# Patient Record
Sex: Female | Born: 1970
Health system: Southern US, Community
[De-identification: ages and names within clinical notes are randomized; demographics above are authoritative.]

## PROBLEM LIST (undated history)

## (undated) DIAGNOSIS — E28319 Asymptomatic premature menopause: Secondary | ICD-10-CM

## (undated) DIAGNOSIS — E785 Hyperlipidemia, unspecified: Secondary | ICD-10-CM

## (undated) DIAGNOSIS — K219 Gastro-esophageal reflux disease without esophagitis: Secondary | ICD-10-CM

## (undated) DIAGNOSIS — R519 Headache, unspecified: Secondary | ICD-10-CM

## (undated) DIAGNOSIS — E119 Type 2 diabetes mellitus without complications: Secondary | ICD-10-CM

## (undated) DIAGNOSIS — I10 Essential (primary) hypertension: Secondary | ICD-10-CM

## (undated) DIAGNOSIS — J309 Allergic rhinitis, unspecified: Secondary | ICD-10-CM

## (undated) DIAGNOSIS — R51 Headache: Secondary | ICD-10-CM

## (undated) HISTORY — DX: Headache: R51

## (undated) HISTORY — DX: Type 2 diabetes mellitus without complications: E11.9

## (undated) HISTORY — DX: Hyperlipidemia, unspecified: E78.5

## (undated) HISTORY — PX: LAMINECTOMY: SHX219

## (undated) HISTORY — DX: Asymptomatic premature menopause: E28.319

## (undated) HISTORY — DX: Headache, unspecified: R51.9

## (undated) HISTORY — DX: Allergic rhinitis, unspecified: J30.9

## (undated) HISTORY — DX: Essential (primary) hypertension: I10

## (undated) HISTORY — PX: OPEN NEEDLE BIOPSY SPINE: SUR899

## (undated) HISTORY — PX: EMBOLIZATION: SHX1496

## (undated) HISTORY — PX: VERTEBROPLASTY: SHX113

## (undated) HISTORY — DX: Gastro-esophageal reflux disease without esophagitis: K21.9

---

## 1997-12-20 ENCOUNTER — Other Ambulatory Visit: Admission: RE | Admit: 1997-12-20 | Discharge: 1997-12-20 | Payer: Self-pay | Admitting: Obstetrics & Gynecology

## 1998-11-18 ENCOUNTER — Other Ambulatory Visit: Admission: RE | Admit: 1998-11-18 | Discharge: 1998-11-18 | Payer: Self-pay | Admitting: Obstetrics & Gynecology

## 2000-02-08 ENCOUNTER — Other Ambulatory Visit: Admission: RE | Admit: 2000-02-08 | Discharge: 2000-02-08 | Payer: Self-pay | Admitting: Obstetrics & Gynecology

## 2001-03-19 ENCOUNTER — Encounter: Payer: Self-pay | Admitting: Obstetrics & Gynecology

## 2001-03-19 ENCOUNTER — Encounter: Payer: Self-pay | Admitting: Obstetrics and Gynecology

## 2001-03-19 ENCOUNTER — Ambulatory Visit (HOSPITAL_COMMUNITY): Admission: RE | Admit: 2001-03-19 | Discharge: 2001-03-19 | Payer: Self-pay | Admitting: Obstetrics and Gynecology

## 2001-07-09 ENCOUNTER — Emergency Department (HOSPITAL_COMMUNITY): Admission: EM | Admit: 2001-07-09 | Discharge: 2001-07-09 | Payer: Self-pay | Admitting: Emergency Medicine

## 2001-07-15 ENCOUNTER — Ambulatory Visit (HOSPITAL_COMMUNITY): Admission: RE | Admit: 2001-07-15 | Discharge: 2001-07-15 | Payer: Self-pay | Admitting: Family Medicine

## 2001-07-15 ENCOUNTER — Encounter: Payer: Self-pay | Admitting: Family Medicine

## 2002-01-21 ENCOUNTER — Ambulatory Visit (HOSPITAL_COMMUNITY): Admission: RE | Admit: 2002-01-21 | Discharge: 2002-01-21 | Payer: Self-pay | Admitting: Family Medicine

## 2002-01-21 ENCOUNTER — Encounter: Payer: Self-pay | Admitting: Family Medicine

## 2002-01-28 ENCOUNTER — Ambulatory Visit (HOSPITAL_COMMUNITY): Admission: RE | Admit: 2002-01-28 | Discharge: 2002-01-28 | Payer: Self-pay | Admitting: Neurosurgery

## 2002-01-28 ENCOUNTER — Encounter (INDEPENDENT_AMBULATORY_CARE_PROVIDER_SITE_OTHER): Payer: Self-pay | Admitting: *Deleted

## 2002-01-28 ENCOUNTER — Encounter: Payer: Self-pay | Admitting: Neurosurgery

## 2002-01-29 ENCOUNTER — Ambulatory Visit: Admission: RE | Admit: 2002-01-29 | Discharge: 2002-02-16 | Payer: Self-pay | Admitting: Radiation Oncology

## 2002-01-30 ENCOUNTER — Encounter: Payer: Self-pay | Admitting: Neurosurgery

## 2002-01-30 ENCOUNTER — Encounter (INDEPENDENT_AMBULATORY_CARE_PROVIDER_SITE_OTHER): Payer: Self-pay | Admitting: *Deleted

## 2002-01-30 ENCOUNTER — Ambulatory Visit (HOSPITAL_COMMUNITY): Admission: RE | Admit: 2002-01-30 | Discharge: 2002-01-30 | Payer: Self-pay | Admitting: Neurosurgery

## 2002-02-02 ENCOUNTER — Encounter: Admission: RE | Admit: 2002-02-02 | Discharge: 2002-02-02 | Payer: Self-pay | Admitting: Oncology

## 2002-02-02 ENCOUNTER — Encounter: Payer: Self-pay | Admitting: Oncology

## 2002-02-17 ENCOUNTER — Ambulatory Visit (HOSPITAL_COMMUNITY): Admission: RE | Admit: 2002-02-17 | Discharge: 2002-02-17 | Payer: Self-pay | Admitting: Oncology

## 2002-02-17 ENCOUNTER — Encounter (INDEPENDENT_AMBULATORY_CARE_PROVIDER_SITE_OTHER): Payer: Self-pay

## 2002-05-01 ENCOUNTER — Encounter: Payer: Self-pay | Admitting: Neurosurgery

## 2002-05-05 ENCOUNTER — Encounter (INDEPENDENT_AMBULATORY_CARE_PROVIDER_SITE_OTHER): Payer: Self-pay | Admitting: *Deleted

## 2002-05-05 ENCOUNTER — Encounter: Payer: Self-pay | Admitting: Neurosurgery

## 2002-05-05 ENCOUNTER — Inpatient Hospital Stay (HOSPITAL_COMMUNITY): Admission: RE | Admit: 2002-05-05 | Discharge: 2002-05-06 | Payer: Self-pay | Admitting: Neurosurgery

## 2002-05-25 ENCOUNTER — Ambulatory Visit (HOSPITAL_COMMUNITY): Admission: RE | Admit: 2002-05-25 | Discharge: 2002-05-26 | Payer: Self-pay | Admitting: Interventional Radiology

## 2002-06-29 ENCOUNTER — Encounter: Admission: RE | Admit: 2002-06-29 | Discharge: 2002-06-29 | Payer: Self-pay | Admitting: Neurosurgery

## 2002-06-29 ENCOUNTER — Encounter: Payer: Self-pay | Admitting: Neurosurgery

## 2002-08-17 ENCOUNTER — Encounter: Payer: Self-pay | Admitting: Neurosurgery

## 2002-08-17 ENCOUNTER — Encounter: Admission: RE | Admit: 2002-08-17 | Discharge: 2002-08-17 | Payer: Self-pay | Admitting: Neurosurgery

## 2002-08-31 ENCOUNTER — Encounter: Payer: Self-pay | Admitting: Neurosurgery

## 2002-08-31 ENCOUNTER — Ambulatory Visit (HOSPITAL_COMMUNITY): Admission: RE | Admit: 2002-08-31 | Discharge: 2002-08-31 | Payer: Self-pay | Admitting: Neurosurgery

## 2002-10-12 ENCOUNTER — Ambulatory Visit (HOSPITAL_COMMUNITY): Admission: RE | Admit: 2002-10-12 | Discharge: 2002-10-12 | Payer: Self-pay | Admitting: Interventional Radiology

## 2002-10-14 ENCOUNTER — Inpatient Hospital Stay (HOSPITAL_COMMUNITY): Admission: RE | Admit: 2002-10-14 | Discharge: 2002-10-16 | Payer: Self-pay | Admitting: Interventional Radiology

## 2002-11-27 ENCOUNTER — Other Ambulatory Visit: Admission: RE | Admit: 2002-11-27 | Discharge: 2002-11-27 | Payer: Self-pay | Admitting: *Deleted

## 2002-12-22 ENCOUNTER — Inpatient Hospital Stay (HOSPITAL_COMMUNITY): Admission: RE | Admit: 2002-12-22 | Discharge: 2002-12-24 | Payer: Self-pay | Admitting: Neurosurgery

## 2002-12-22 ENCOUNTER — Encounter: Payer: Self-pay | Admitting: Neurosurgery

## 2003-08-30 ENCOUNTER — Ambulatory Visit: Admission: RE | Admit: 2003-08-30 | Discharge: 2003-10-15 | Payer: Self-pay | Admitting: Radiation Oncology

## 2003-12-21 ENCOUNTER — Ambulatory Visit (HOSPITAL_COMMUNITY): Admission: RE | Admit: 2003-12-21 | Discharge: 2003-12-21 | Payer: Self-pay | Admitting: Obstetrics & Gynecology

## 2004-02-01 ENCOUNTER — Ambulatory Visit (HOSPITAL_COMMUNITY): Admission: RE | Admit: 2004-02-01 | Discharge: 2004-02-01 | Payer: Self-pay | Admitting: Obstetrics and Gynecology

## 2004-02-26 ENCOUNTER — Inpatient Hospital Stay (HOSPITAL_COMMUNITY): Admission: AD | Admit: 2004-02-26 | Discharge: 2004-02-26 | Payer: Self-pay | Admitting: Obstetrics and Gynecology

## 2004-04-21 ENCOUNTER — Inpatient Hospital Stay (HOSPITAL_COMMUNITY): Admission: AD | Admit: 2004-04-21 | Discharge: 2004-04-21 | Payer: Self-pay | Admitting: Obstetrics and Gynecology

## 2004-05-09 ENCOUNTER — Inpatient Hospital Stay (HOSPITAL_COMMUNITY): Admission: RE | Admit: 2004-05-09 | Discharge: 2004-05-15 | Payer: Self-pay | Admitting: Obstetrics and Gynecology

## 2004-05-09 ENCOUNTER — Ambulatory Visit: Payer: Self-pay | Admitting: Physical Medicine & Rehabilitation

## 2004-05-30 ENCOUNTER — Ambulatory Visit: Admission: RE | Admit: 2004-05-30 | Discharge: 2004-07-16 | Payer: Self-pay | Admitting: Radiation Oncology

## 2004-06-09 ENCOUNTER — Encounter
Admission: RE | Admit: 2004-06-09 | Discharge: 2004-09-07 | Payer: Self-pay | Admitting: Physical Medicine & Rehabilitation

## 2004-06-13 ENCOUNTER — Ambulatory Visit: Payer: Self-pay | Admitting: Physical Medicine & Rehabilitation

## 2004-06-22 ENCOUNTER — Other Ambulatory Visit: Admission: RE | Admit: 2004-06-22 | Discharge: 2004-06-22 | Payer: Self-pay | Admitting: Obstetrics and Gynecology

## 2004-07-28 ENCOUNTER — Ambulatory Visit: Payer: Self-pay | Admitting: Physical Medicine & Rehabilitation

## 2004-08-10 ENCOUNTER — Ambulatory Visit: Admission: RE | Admit: 2004-08-10 | Discharge: 2004-08-10 | Payer: Self-pay | Admitting: Radiation Oncology

## 2004-10-26 ENCOUNTER — Encounter
Admission: RE | Admit: 2004-10-26 | Discharge: 2005-01-24 | Payer: Self-pay | Admitting: Physical Medicine & Rehabilitation

## 2004-10-26 ENCOUNTER — Ambulatory Visit: Payer: Self-pay | Admitting: Physical Medicine & Rehabilitation

## 2005-02-28 ENCOUNTER — Encounter: Admission: RE | Admit: 2005-02-28 | Discharge: 2005-02-28 | Payer: Self-pay | Admitting: Neurosurgery

## 2005-04-20 ENCOUNTER — Ambulatory Visit: Payer: Self-pay | Admitting: Physical Medicine & Rehabilitation

## 2005-04-20 ENCOUNTER — Encounter
Admission: RE | Admit: 2005-04-20 | Discharge: 2005-07-19 | Payer: Self-pay | Admitting: Physical Medicine & Rehabilitation

## 2005-08-19 ENCOUNTER — Encounter: Admission: RE | Admit: 2005-08-19 | Discharge: 2005-08-19 | Payer: Self-pay | Admitting: Neurosurgery

## 2006-05-09 ENCOUNTER — Encounter: Admission: RE | Admit: 2006-05-09 | Discharge: 2006-05-09 | Payer: Self-pay | Admitting: Neurosurgery

## 2006-08-01 ENCOUNTER — Other Ambulatory Visit: Admission: RE | Admit: 2006-08-01 | Discharge: 2006-08-01 | Payer: Self-pay | Admitting: Obstetrics and Gynecology

## 2006-10-14 ENCOUNTER — Encounter: Admission: RE | Admit: 2006-10-14 | Discharge: 2006-10-14 | Payer: Self-pay | Admitting: Neurosurgery

## 2007-11-13 ENCOUNTER — Encounter: Admission: RE | Admit: 2007-11-13 | Discharge: 2007-11-13 | Payer: Self-pay | Admitting: Neurosurgery

## 2007-12-10 ENCOUNTER — Other Ambulatory Visit: Admission: RE | Admit: 2007-12-10 | Discharge: 2007-12-10 | Payer: Self-pay | Admitting: Obstetrics and Gynecology

## 2008-10-03 ENCOUNTER — Encounter: Admission: RE | Admit: 2008-10-03 | Discharge: 2008-10-03 | Payer: Self-pay | Admitting: Neurosurgery

## 2009-01-04 ENCOUNTER — Other Ambulatory Visit: Admission: RE | Admit: 2009-01-04 | Discharge: 2009-01-04 | Payer: Self-pay | Admitting: Obstetrics and Gynecology

## 2009-08-17 ENCOUNTER — Other Ambulatory Visit: Admission: RE | Admit: 2009-08-17 | Discharge: 2009-08-17 | Payer: Self-pay | Admitting: Obstetrics and Gynecology

## 2010-01-14 ENCOUNTER — Emergency Department (HOSPITAL_COMMUNITY)
Admission: EM | Admit: 2010-01-14 | Discharge: 2010-01-14 | Payer: Self-pay | Source: Home / Self Care | Admitting: Emergency Medicine

## 2010-03-09 ENCOUNTER — Other Ambulatory Visit
Admission: RE | Admit: 2010-03-09 | Discharge: 2010-03-09 | Payer: Self-pay | Source: Home / Self Care | Admitting: Obstetrics and Gynecology

## 2010-04-10 ENCOUNTER — Emergency Department (HOSPITAL_COMMUNITY)
Admission: EM | Admit: 2010-04-10 | Discharge: 2010-04-10 | Payer: Self-pay | Source: Home / Self Care | Admitting: Emergency Medicine

## 2010-04-10 LAB — POCT CARDIAC MARKERS
CKMB, poc: 2.4 ng/mL (ref 1.0–8.0)
Myoglobin, poc: 119 ng/mL (ref 12–200)
Troponin i, poc: <0.05 ng/mL (ref 0.00–0.09)

## 2010-05-23 LAB — CBC
HCT: 41.6 % (ref 36.0–46.0)
Hemoglobin: 14.1 g/dL (ref 12.0–15.0)
MCH: 30.4 pg (ref 26.0–34.0)
MCHC: 33.8 g/dL (ref 30.0–36.0)
MCV: 89.9 fL (ref 78.0–100.0)
Platelets: 318 10*3/uL (ref 150–400)
RBC: 4.63 MIL/uL (ref 3.87–5.11)
RDW: 13.5 % (ref 11.5–15.5)
WBC: 5.4 10*3/uL (ref 4.0–10.5)

## 2010-05-23 LAB — COMPREHENSIVE METABOLIC PANEL
ALT: 17 U/L (ref 0–35)
AST: 17 U/L (ref 0–37)
Albumin: 3.4 g/dL — ABNORMAL LOW (ref 3.5–5.2)
Alkaline Phosphatase: 70 U/L (ref 39–117)
BUN: 12 mg/dL (ref 6–23)
CO2: 29 mEq/L (ref 19–32)
Calcium: 9.1 mg/dL (ref 8.4–10.5)
Chloride: 105 mEq/L (ref 96–112)
Creatinine, Ser: 0.85 mg/dL (ref 0.4–1.2)
GFR calc Af Amer: >60 mL/min (ref >60)
GFR calc non Af Amer: >60 mL/min (ref >60)
Glucose, Bld: 108 mg/dL — ABNORMAL HIGH (ref 70–99)
Potassium: 4 mEq/L (ref 3.5–5.1)
Sodium: 140 mEq/L (ref 135–145)
Total Bilirubin: 0.5 mg/dL (ref 0.3–1.2)
Total Protein: 6.7 g/dL (ref 6.0–8.3)

## 2010-05-23 LAB — DIFFERENTIAL
Basophils Absolute: 0 10*3/uL (ref 0.0–0.1)
Basophils Relative: 0 % (ref 0–1)
Eosinophils Absolute: 0.2 10*3/uL (ref 0.0–0.7)
Eosinophils Relative: 3 % (ref 0–5)
Lymphocytes Relative: 43 % (ref 12–46)
Lymphs Abs: 2.3 10*3/uL (ref 0.7–4.0)
Monocytes Absolute: 0.4 10*3/uL (ref 0.1–1.0)
Monocytes Relative: 8 % (ref 3–12)
Neutro Abs: 2.4 10*3/uL (ref 1.7–7.7)
Neutrophils Relative %: 45 % (ref 43–77)

## 2010-05-23 LAB — LIPASE, BLOOD: Lipase: 39 U/L (ref 11–59)

## 2010-05-23 LAB — TROPONIN I: Troponin I: 0.01 ng/mL (ref 0.00–0.06)

## 2010-07-28 NOTE — Op Note (Signed)
   NAME:  Melanie Benson, Melanie Benson                        ACCOUNT NO.:  [id number]   MEDICAL RECORD NO.:  [id number]                   PATIENT TYPE:  INP   LOCATION:  2889                                 FACILITY:  [facility]   PHYSICIAN:  Payton Doughty, M.D.                   DATE OF BIRTH:  07-03-1970   DATE OF PROCEDURE:  05/05/2002  DATE OF DISCHARGE:                                 OPERATIVE REPORT   PREOPERATIVE DIAGNOSIS:  L5 tumor.   POSTOPERATIVE DIAGNOSIS:  L5 tumor.   OPERATION PERFORMED:  Left L5 laminectomy for mass biopsy.   SURGEON:  Payton Doughty, M.D.   ANESTHESIA:  General endotracheal.   PREP:  Sterile Betadine prep and scrub with alcohol wipe.   COMPLICATIONS:  None.   ASSISTANT:  Hewitt Shorts, M.D.   INDICATIONS FOR PROCEDURE:  The patient is a 40 year old right-handed black  lady with a tumor of L5.   DESCRIPTION OF PROCEDURE:  The patient was taken to the operating room,  smoothly anesthetized, intubated and placed  prone on the operating table.  Following shave, prep and drape in the usual sterile fashion, the skin was  infiltrated with 1% lidocaine with 1:400,000 epinephrine.  The skin was  incised from mid-L1 to mid-L4 through the abundant adipose tissue.  The  lamina of L5 was exposed.  Intraoperative x-ray confirmed correctness of the  level.  The left L5 laminectomy was carried out and tissue and the bone  sent.  The bone was quite bloody and it was beefy red in color.  Using a  combination of bone wax, bipolar cautery, thrombin, Gelfoam and time,  hemostasis was achieved.  The biopsy came back, no plasma cells in the bone.  The wound was irrigated once again, hemostasis assured.  The fascia was  reapproximated with 0 Vicryl in interrupted fashion.  Subcutaneous tissue  was reapproximated with 0 Vicryl in interrupted fashion.  Subcuticular  tissue was reapproximated with 3-0 Vicryl in interrupted fashion.  Skin was  closed with 3-0 nylon in running locked  fashion.  Betadine and Telfa  dressing was applied and made occlusive with OpSite.  The patient was then  transferred to the recovery room in good condition.                                                Payton Doughty, M.D.    MWR/MEDQ  D:  05/05/2002  T:  05/05/2002  Job:  [id number]
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
## 2010-07-28 NOTE — Assessment & Plan Note (Signed)
MEDICAL RECORD NUMBER:  [id number].   Hang is back regarding her lower extremity weakness and sensory loss. She  has been generally improving with her posture and strength. She has been  involved in physical therapy but has been reluctant to pursue this  aggressively as she only had 20 visits, and she is paying $40 or $50 a copay  per visit. She uses Vicodin usually at night time one or two times a day for  breakthrough pain in the legs. She uses ibuprofen during the day. The  Neurontin has been tolerated fairly well for her dysesthesias, and she is  using 300 mg t.i.d. She describes her pain as 4/10 in intensity. She  describes it as intermittent and aching. Interferes with general activity  and relations with others and enjoyment of life on a moderate level. The  left leg seems to be most involved. Sleep is fair. Pain is worse with  walking and standing and activities and improves with rest and medications.  She can walk about 10 or 15 minutes at a time. She usually walks with a  walker. She likes to push the baby's carriage as it gives her some support.   REVIEW OF SYSTEMS:  The patient reports continued bladder infrequency.  Detrol has helped that to a certain extent. She also reports numbness and  tingling. No other changes from a constitutional, GU, GI, or  cardiorespiratory standpoint on her review of systems.   SOCIAL HISTORY:  The patient continues to live with her husband. She is  taking care of the baby independently now.   PHYSICAL EXAMINATION:  Blood pressure is 144/80, pulse 106, respiratory rate  16. She is saturating 99% on room air. The patient is pleasant in no acute  distress. She remains obese. She is alert and oriented x3. Affect is bright  and appropriate. Appearance is generally well kept. Gait is shuffling type  with extension of the low back so that her back sits on the pelvis for  support. She overall keeps her balance fairly well. Once she moves forward,  she gets more pain in the hip regions. Strength is improving nicely in both  legs and really is 4+ to 5/5 at ankles and knees. With hip flexion, she is 4  to 4+/5 on the right and 3+ to 4/5 on the left. Sensory is diminished along  the anterolateral thigh over the left side as well as posterior thigh. Rate  sensory exam there at 1/2. Reflexes were decreased throughout. Cognitively,  the patient is appropriate. On examination of the low back, there was some  pain around the prior surgical site. She had minimal tenderness along the  iliac crest and gluteal regions. Greater trochanter regions were nontender.   ASSESSMENT:  1.  Lower extremity weakness. It appears to be more upper lumbar in origin.      Her hemangioma is in the L5 region. I am not sure if there was a problem      related to her anesthesia during the labor and delivery process or not.      Nevertheless, it appears that she is making nice functional neurological      gains every month.  2.  Urinary incontinence.  3.  Spasticity.   PLAN:  1.  I think the patient can do as well with home exercise program. We talked      about appropriate posture. I think some of her pelvic pain when she      tries to walk  in the more normal position is due to her hip flexion and      extensor weakness. Right now, she walks by sitting on her pelvis      literally.  2.  Bladder. Continue with Detrol. Hopefully, this will improve as she gets      better with her mobility.  3.  The patient may continue with Neurontin 300 mg t.i.d. I did give her      baclofen 5 to 10 mg to use q.h.s. p.r.n. for spasms. She may use during      the day also if she would like.  4.  Still could consider nerve conduction/EMG studies although I am not sure      if the plan will change, even if we      discover plexopathy.  5.  Will see the patient back in about three months' time.      ZTS/MedQ  D:  07/28/2004 16:25:53  T:  07/29/2004 09:19:52  Job #:  [id number]    cc:   Naima A. Normand Sloop, M.D.  [address]  [place]  [place] [address]  Fax: [phone]
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
## 2010-07-28 NOTE — Assessment & Plan Note (Signed)
MEDICAL RECORD NUMBER:  [id number]   Melanie Benson has been doing progressively better. She still does not have her full  endurance back and gets some cramping occasionally. She has come off the  baclofen and the Detrol as her bladder seems to be settling down. She is on  the Neurontin 300 mg t.i.d. Her foot dysesthesias have very much improved  and the Neurontin seems to help control spasms sufficiently. She had  radiation therapy performed to her back and the patient is supposed to  follow up with Dr. Channing Mutters in the next couple of weeks regarding another MRI of  the lumbar spine. The patient's daughter is doing well. She has a lot of  family in the house currently. She is responsible for 90% of her child's  care, though. The patient rates her pain at a 1-4/10 and describes it as  intermittent, dull, tingling, and aching. Sometimes it is worse at night but  generally this is improved. Pain increases with standing, improves with rest  and pacing. The patient tries to do some walking but most of her exercise  usually centers around the care of her child. She does not belong to a gym  or exercise on a regular type of schedule.   REVIEW OF SYSTEMS:  The patient reports numbness, tingling, spasms. She  reports numbness in the vaginal area which affects intercourse. She reports  some night sweats.   SOCIAL HISTORY:  Pertinent positives listed above. 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   PHYSICAL EXAMINATION:  Blood pressure is 142/80, pulse is 92, respiratory  rate is 16, she is saturating 100% in room air. She remains obese. Her  affect is bright and appropriate. She is walking without any deficits. Low  back range of motion is improved significantly with 40-50 degrees of flexion  noted without problem. She is extending without difficulty. Strength is 5/5  in all extremities with a slight bit of weakness proximally only. Sensory  exam is decreased along the anterolateral  thigh and into the groin region. I  would rate the sensory function at 1 out of 2. Reflexes are decreased.  Cognitively she is appropriate. Surgical site is stable. Heart is regular  rate and rhythm. Lungs are clear.   ASSESSMENT:  1.  Lower extremity weakness due to cauda equina type syndrome. The patient      continued to make improvements. We discussed the fact that she may have      some long-term sensory dysfunction, although I would expect to see      further improvement over the next several months.  2.  Bladder. She is doing well with the Detrol. Observe.  3.  Pain. Neurontin is at 300 mg t.i.d. I reviewed a tapering plan of this      medication to off over the next few weeks' time. She may resume this as      needed if she finds out her pain increases.  4.  Neurosurgery follow-up scheduled with Dr. Channing Mutters.  5.  Encouraged more aggressive exercise, i.e. joining a gym and working on      pool therapies and stationary bike/treadmill under initial supervision.  6.  Will see the patient back in 6 months' time.      Ranelle Oyster, M.D.  Electronically Signed    ZTS/MedQ  D:  10/27/2004 12:47:24  T:  10/27/2004 14:15:34  Job #:  [id number]   cc:   Naima A. Dillard,  M.D.  Fax: [phone]

## 2010-07-28 NOTE — Discharge Summary (Signed)
NAME:  ADELENE, POLIVKA              ACCOUNT NO.:  [id number]   MEDICAL RECORD NO.:  [id number]          PATIENT TYPE:  INP   LOCATION:  9102                          FACILITY:  [facility]   PHYSICIAN:  Crist Fat. Rivard, M.D. DATE OF BIRTH:  Jan 10, 1971   DATE OF ADMISSION:  05/09/2004  DATE OF DISCHARGE:  05/15/2004                                 DISCHARGE SUMMARY   ADMISSION DIAGNOSES:  1.  Intrauterine pregnancy at 38-3/7 weeks.  2.  Spinal hemangioma at approximately L4.  3.  Large fibroids.  4.  Positive Beta Strep.   DISCHARGE DIAGNOSES:  1.  Primary low transverse cesarean section.  2.  Breech presentation.  3.  Uterine fibroids.  4.  Spinal hemangioma.  5.  Improving motor function.   PROCEDURE:  1.  Primary low transverse cesarean section.  2.  Spinal anesthesia.   HOSPITAL COURSE:  Ms. Cowman is a 40 year old gravida 1, para 0, at 38-4/7  weeks who was admitted on May 09, 2004, for scheduled cesarean section  secondary to breech presentation.  The patient's history has also been  remarkable for a spinal hemangioma and a history of fibroids.  The patient  had been followed during her pregnancy by the physicians at [facility]  as well as by Payton Doughty, M.D. and Raul Del, M.D. prior to  delivery.  On the day of admission, she was doing well.  The decision had  been made to proceed with spinal anesthesia as long as it could be done  above the L4 level.  She had a cesarean section with delivery of a viable  female, Apgars were 7 and 9.      VLL/MEDQ  D:  05/18/2004  T:  05/18/2004  Job:  [id number]
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
## 2010-07-28 NOTE — Assessment & Plan Note (Signed)
DATE OF VISIT:  June 13, 2004.   MEDICAL RECORD NUMBER:  [id number].   HISTORY OF PRESENT ILLNESS:  This is a 40 year old black female who I saw at  [facility] with weakness after enduring the late stages of her  pregnancy and birth.  The patient had a spinal hemangioma at L4, which may  have been slightly increased from prior testing, but was felt to be  noncontributory to her leg weakness.  There was no obvious myelopathy.  There was some question whether the patient had prolonged response to  epidural anesthesia.  The patient was seen by me two to three days after  delivery and was already making progress with her mobility.  I recommended  home health therapy really for the fact that she would be closer to the baby  and it probably would have been unrealistic to go for outpatient services.  Unfortunately, the patient had a bad experience with her home health  therapist and we sent her to outpatient physical therapy, which has begun  strengthening exercises and work on her gait.  The patient still uses a  rolling walker at this point.  She does do some ambulation in the house  without the walker.  She denies any frank paresthesias.  She may have trace  numbness, particularly in the posterior thighs and upper calves.  Strength  is generally improving.  She has some occasional bladder incontinence.  She  has not had a recent urinalysis or culture checked.  Spasms are a problem at  times.  The Neurontin, which was begun at 100 mg q.h.s., seems to help.  We  are planning to increase that to 100 mg t.i.d.  I did tell her to stay away  from breastfeeding the baby if on the Neurontin.  She states that the baby  is having a hard time taking formula and she may need to breastfeed the baby  ultimately.  Sleep is generally fair.   REVIEW OF SYSTEMS:  The patient reports bladder incontinence, numbness,  occasional tingling, trouble walking and spasms.  She also reports  constipation.  A full  review of systems is in the health and history section  of the chart.   SOCIAL HISTORY:  The patient is married.  The family is supportive.  The  baby is doing well.   PHYSICAL EXAMINATION:  VITAL SIGNS:  The blood pressure is 136/75, the pulse  is 92 and the respiratory rate is 16.  She is saturating 99% on room air.  GENERAL APPEARANCE:  The patient is alert and oriented x 3.  Affect is  bright and appropriate.  She walks with a shuffling type of gait.  EXTREMITIES:  On muscle examination, she has continued weakness at 3-3+/5 in  the ankle dorsiflexors and plantar flexors.  Knees are 3+-4/5 with flexion  and extension.  The hips are 3+-4/5.  I noted decreased reflexes throughout.  No clonus was noted.  Sensory exam was decreased along the posterior thigh  and upper calf at 1/2.  Upper extremity strength was good.  NEUROLOGIC:  Reflexes were 2+.  Cognitively the patient is appropriate.  The  patient was stable with her transfers and gait, although she needed some  extra time.  She ambulated with a slight steppage type of pattern.   ASSESSMENT:  1.  Lower extremity weakness.  Unclear if this related to her spinal      hemangioma, although this is fairly unlikely.  They question whether she  had a lumbosacral plexopathy due to her pregnancy.  It appears that she      is making functional neurological progress at this point.  2.  Urinary incontinence.  3.  Spasms.   PLAN:  1.  Continue outpatient physical therapy to work on gait strengthening and      imbalance.  2.  Will check UA and C&S today.  She needs to get herself on a scheduled      bladder program.  I recommended every two hours while awake.  She would      do well with fluid rationing at night to avoid nocturnal incontinence.  3.  For spasms, the Neurontin appears to be helping at this point.  Will      keep her at current schedule, which will bring her to 100 mg t.i.d.  I      told her to discuss with her OB/GYN  regarding continuing the Neurontin      if she chooses to breastfeed the baby.  4.  Consider nerve conduction EMG studies at a later time.  5.  I will see the patient back in about six weeks' time.      ZTS/MedQ  D:  06/13/2004 16:44:02  T:  06/13/2004 18:16:28  Job #:  [id number]   cc:   Naima A. Normand Sloop, M.D.  [address]  [place]  [place] [address]  Fax: [phone]

## 2010-07-28 NOTE — Assessment & Plan Note (Signed)
Melanie Benson is returning regarding her cauda equina syndrome.  She has been  progressing nicely.  She has occasional spasm and pain into the legs.  Strength is improving.  She is having a hard time getting out and exercising  regularly.  There have been a lot of added psychosocial stresses in  her  life which have prevented regular program.  She is working on her weight  gradually but still is overweight.  Dr. Channing Mutters has followed up with the patient  last December after having had the radiation therapy to the spine and  apparently she got a good report in regard to the hemangioma.  Patient is on  really no medication except for an occasional Aleve.  At this point, her  pain on average is a 2 to 3/10, describes it as tingling.  Pain increases  sometimes with standing and improves with rest.  The pain is most prominent  in the low back.  Occasionally she has radiation with sudden movements.   REVIEW OF SYSTEMS:  Patient reports tingling, spasms and weakness.  She  still has urgency with her bladder.  She denies any constitutional, GU, GI  or cardiorespiratory complaints today.   SOCIAL HISTORY:  Patient is at home with her family and apparently her  sister's family has moved in and she has that added financial burden.   PHYSICAL EXAMINATION:  GENERAL APPEARANCE:  Patient is pleasant, in no acute  distress.  She is alert and oriented x3.  Affect is bright and generally  appropriate.  VITAL SIGNS:  Blood pressure 119/76, pulse 81, respiratory rate 16, she is  sating 99% on room air.  LUNGS:  Clear.  CARDIOVASCULAR:  Regular rate and rhythm.  ABDOMEN:  Soft and nontender.  EXTREMITIES:  I examined the left thumb, which is painful at the first MCP  and interphalangeal joint.  This is worse with resisted flexion today.  No  tendon tenderness was noted.  Tinel's test was negative.  Okay sign was  negative.  Fromen's test was negative and Finkelstein's test was negative.  No swelling or skin color  changes were noted in the right hand.  NEUROLOGIC:  Gait is stable.  Reflexes are decreased in the lower  extremities.  Coordination is fair.  Sensation is generally  within normal  limits.  Strength is 5/5 in all four extremities.  Cognitively she is  appropriate.   ASSESSMENT:  1.  Cauda equina-like syndrome.  Patient is progressing nicely.  Encouraged      exercise and as appropriate diet as possible.  2.  Left hand pain, likely this is mild osteoarthritis in the thumb.  I see      no obvious signs of tendon injury.  Recommended Aleve and ice as well as      relative rest over the next two weeks' time.  This seems to be      improving.  3.  Discussed regular stretching program for the low back to work on range      of motion as well as core muscle strengthening.  4.  I will see the patient back as needed in the future.      Ranelle Oyster, M.D.  Electronically Signed     ZTS/MedQ  D:  04/23/2005 09:54:36  T:  04/23/2005 16:26:12  Job #:  [id number]

## 2010-07-28 NOTE — Op Note (Signed)
NAME:  Melanie Benson, Melanie Benson                        ACCOUNT NO.:  [id number]   MEDICAL RECORD NO.:  [id number]                   PATIENT TYPE:  INP   LOCATION:  3010                                 FACILITY:  [facility]   PHYSICIAN:  Payton Doughty, M.D.                   DATE OF BIRTH:  [birth year]   DATE OF PROCEDURE:  12/22/2002  DATE OF DISCHARGE:                                 OPERATIVE REPORT   PREOPERATIVE DIAGNOSIS:  Vertebral hemangioma causing spinal stenosis.   POSTOPERATIVE DIAGNOSIS:  Vertebral hemangioma causing spinal stenosis.   OPERATIVE PROCEDURE:  L5 decompressive laminectomy for spinal stenosis.   SURGEON:  Payton Doughty, M.D.   NURSE ASSISTANT:  [facility]   DOCTOR ASSISTANT:  Hewitt Shorts, M.D.   SERVICE:  Neurosurgery.   ANESTHESIA:  General endotracheal.   PREPARATION:  Prepped with Betadine prep and scrubbed with alcohol wipe.   COMPLICATIONS:  None.   BODY OF TEXT:  A 40 year old girl with a spinal hemangioma causing  neurogenic claudication.  She was taken to the operating room and smoothly  anesthetized and intubated and placed prone on the operating table.  Following shave, prepped and draped in the usual sterile fashion.  Skin was  infiltrated with 1% lidocaine with 1:400,000 epinephrine.  The old skin  incision was reopened and extended approximately 2 cm at each end.  The  lamina of L5 was dissected free through several inches of subcutaneous fat.  The __________ retractor was placed and intraoperative x-ray was used to  confirm correctness of the level.  Because of the known hemangioma, we  proceeded cautiously, yet efficiently to drill down the left-sided lamina  until brisk bleeding was encountered from the hemangioma.  It was packed  with thrombin, Gelfoam and bone wax.  Total laminectomy was then carried out  to identify the dura.  The bilateral decompression was then undertaken by  removing the lamina out to within about 5 mm of the pars  interarticularis.  The bleeding of the hemangioma was controlled with bone wax and thrombin-  soaked Gelfoam and bipolar cautery where appropriate.  Complete  decompression of the spinal canal was completed.  The neural foramina were  explored and found to be open.  Anterior compression of the hemangioma was  not undertaken because it was not possible to achieve direct vision nor  control of the lesion.  Following complete decompression, the wound was  irrigated and hemostasis assured.  The fascia was reapproximated with 0  Vicryl in interrupted fashion, subcutaneous tissue was reapproximated with 0  Vicryl in interrupted fashion,  subcuticular tissue was reapproximated with 3-0 Vicryl in interrupted  fashion and the skin was closed with 3-0 nylon in a running-locked fashion.  Betadine and Telfa dressing were applied and made occlusive with OpSite and  the patient returned to the recovery room.  Payton Doughty, M.D.    MWR/MEDQ  D:  12/22/2002  T:  12/22/2002  Job:  [id number]
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
## 2010-07-28 NOTE — Op Note (Signed)
NAME:  Melanie Benson, Melanie Benson              ACCOUNT NO.:  [id number]   MEDICAL RECORD NO.:  [id number]          PATIENT TYPE:  INP   LOCATION:  9102                          FACILITY:  [facility]   PHYSICIAN:  Naima A. Dillard, M.D. DATE OF BIRTH:  08/24/1970   DATE OF PROCEDURE:  05/09/2004  DATE OF DISCHARGE:                                 OPERATIVE REPORT   PREOPERATIVE DIAGNOSES:  Breech at term with uterine fibroids and spinal  hemangioma.   POSTOPERATIVE DIAGNOSES:  Breech at term with uterine fibroids and spinal  hemangioma.   PROCEDURE:  Primary low transverse cesarean section.   SURGEON:  Dr. Normand Sloop   ASSISTANT:  Dr. Su Hilt   ANESTHESIA:  Spinal.   FINDINGS:  A female infant, footling breech with clear fluids, Apgars of 7  and 9, weight of 7 pounds 5 ounces.   ESTIMATED BLOOD LOSS:  1100 mL.   URINE OUTPUT:  300 mL.   IV FLUIDS:  __________ mL.   COMPLICATIONS:  None.  The patient went to PACU in good condition.   PROCEDURE IN DETAIL:  The patient was taken to the operating room, where she  was given spinal anesthesia, placed in dorsal supine position with a left  lateral tilt and prepped and draped in a normal sterile fashion.  Foley  catheter was placed.  When her anesthesia was found to be adequate, a  Pfannenstiel skin incision was then made with a scalpel and carried down to  the fascia using Bovie cautery.  The fascia was excision in the midline and  extended bilaterally using Bovie cautery.  Kochers x 2 were placed in the  superior aspect of the fascia which was dissected off the rectus muscle both  sharply and bluntly.  The inferior aspect of the fascia was dissected off  the rectus muscle in a similar fashion.  The muscle was separated in  midline.  The peritoneum was identified, entered bluntly, and separated  bluntly.  The bladder blade was reinserted.  The bladder blade was  reinserted.  The vesicouterine peritoneum was grasped, entered with  Metzenbaum  scissors and extended bilaterally.  Bladder flap was created  digitally.  Bladder blade was reinserted.  A primary lower transverse  uterine incision was then made with a scalpel, and there was large  vasculature down at the lower uterine segment.  The uterine incision was  extended bluntly, and the amniotic fluid sac was entered sharply.  Clear  fluid was noted.  The infant was double footling breech.  Both feet were  grasped, and the infant was delivered using breech maneuvers with a wet  towel without difficulty.  Placenta was clamped and cut.  Infant was handed  off to awaiting pediatrician.  Cord blood was obtained.  The placenta was  delivered manually.  The uterus was cleared of all clot and debris.  The  cervix was dilated with sponge, forceps very gently.  The uterine incision  was repaired with 0 Vicryl in a running lock fashion.  A second layer of 0  Vicryl was used for imbrication.  Hemostasis was assured.  The  patient's  left tube and ovary had normal appearance.  The patient's right fallopian  tube and ovary felt normal.  The patient had normal abdominal anatomy.  Irrigation was done and was noted to be hemostatic.  The peritoneum was  closed with 0 chromic in a running fashion.  The muscles were inspected and  noted to be hemostatic.  The fascia was closed with 0 Vicryl in a running  fashion.  Subcutaneous layer was mad hemostatic with Bovie cautery.  The  skin was closed with 3-0 Monocryl in a subcuticular fashion.  Sponge, lap,  and needle counts were correct x 2.  The patient was taken to the recovery  room in stable condition.      NAD/MEDQ  D:  05/09/2004  T:  05/09/2004  Job:  [id number]

## 2010-10-19 ENCOUNTER — Other Ambulatory Visit (HOSPITAL_COMMUNITY): Payer: Self-pay | Admitting: Obstetrics and Gynecology

## 2010-10-19 DIAGNOSIS — Z1231 Encounter for screening mammogram for malignant neoplasm of breast: Secondary | ICD-10-CM

## 2010-10-25 ENCOUNTER — Ambulatory Visit (HOSPITAL_COMMUNITY): Payer: Self-pay

## 2010-10-27 ENCOUNTER — Ambulatory Visit (HOSPITAL_COMMUNITY)
Admission: RE | Admit: 2010-10-27 | Discharge: 2010-10-27 | Disposition: A | Discharge status: Home / Self Care | Payer: Self-pay | Source: Ambulatory Visit | Attending: Obstetrics and Gynecology | Admitting: Obstetrics and Gynecology

## 2010-10-27 DIAGNOSIS — Z1231 Encounter for screening mammogram for malignant neoplasm of breast: Secondary | ICD-10-CM | POA: Insufficient documentation

## 2011-10-01 ENCOUNTER — Other Ambulatory Visit (HOSPITAL_COMMUNITY): Payer: Self-pay | Admitting: Obstetrics and Gynecology

## 2011-11-10 IMAGING — CR DG CHEST 2V
2 series · 2 of 2 positions shown · non-contrast
Comparison: 12/22/2002

CLINICAL DATA: Chest pain

CHEST - 2 VIEW

[w chest pa]
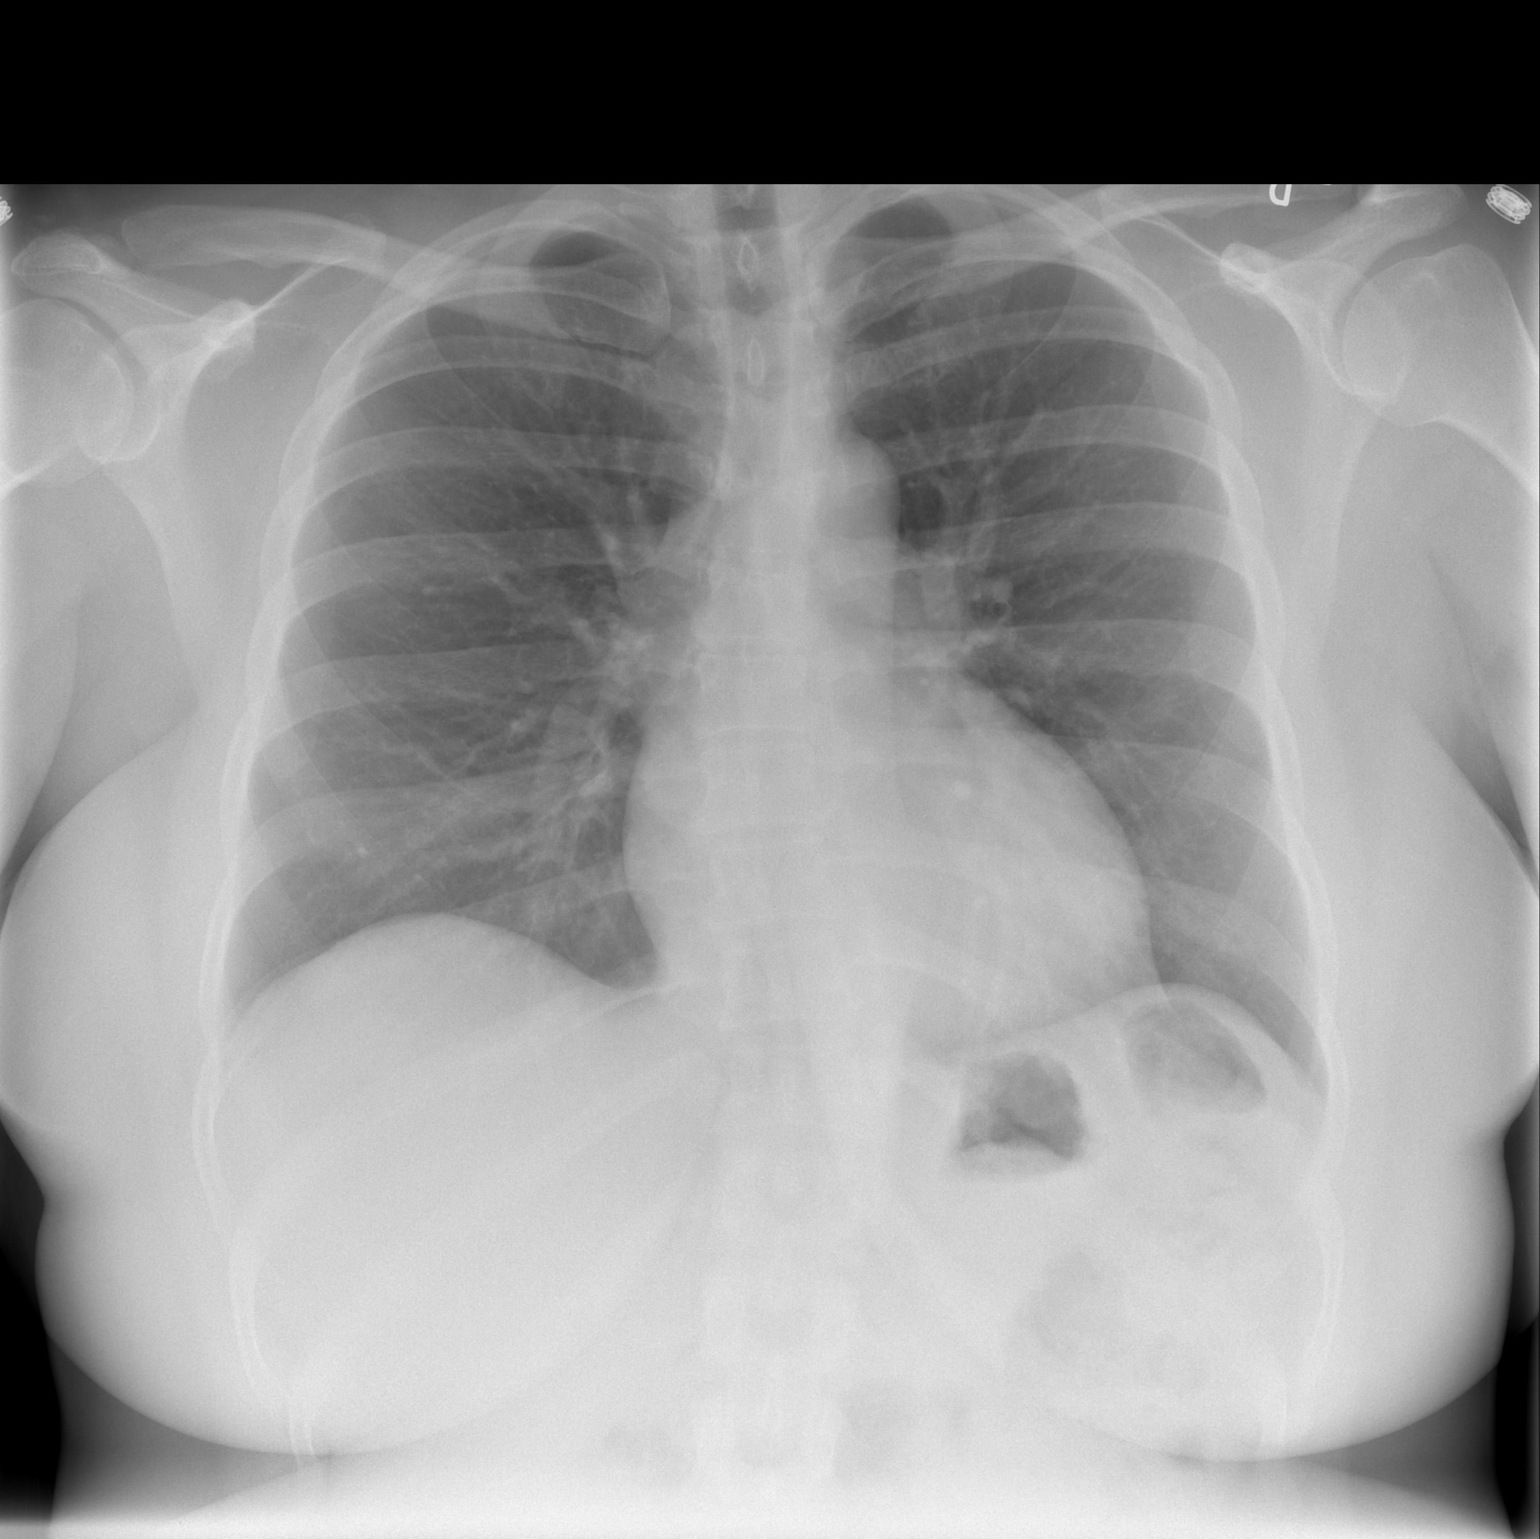

[w chest lat]
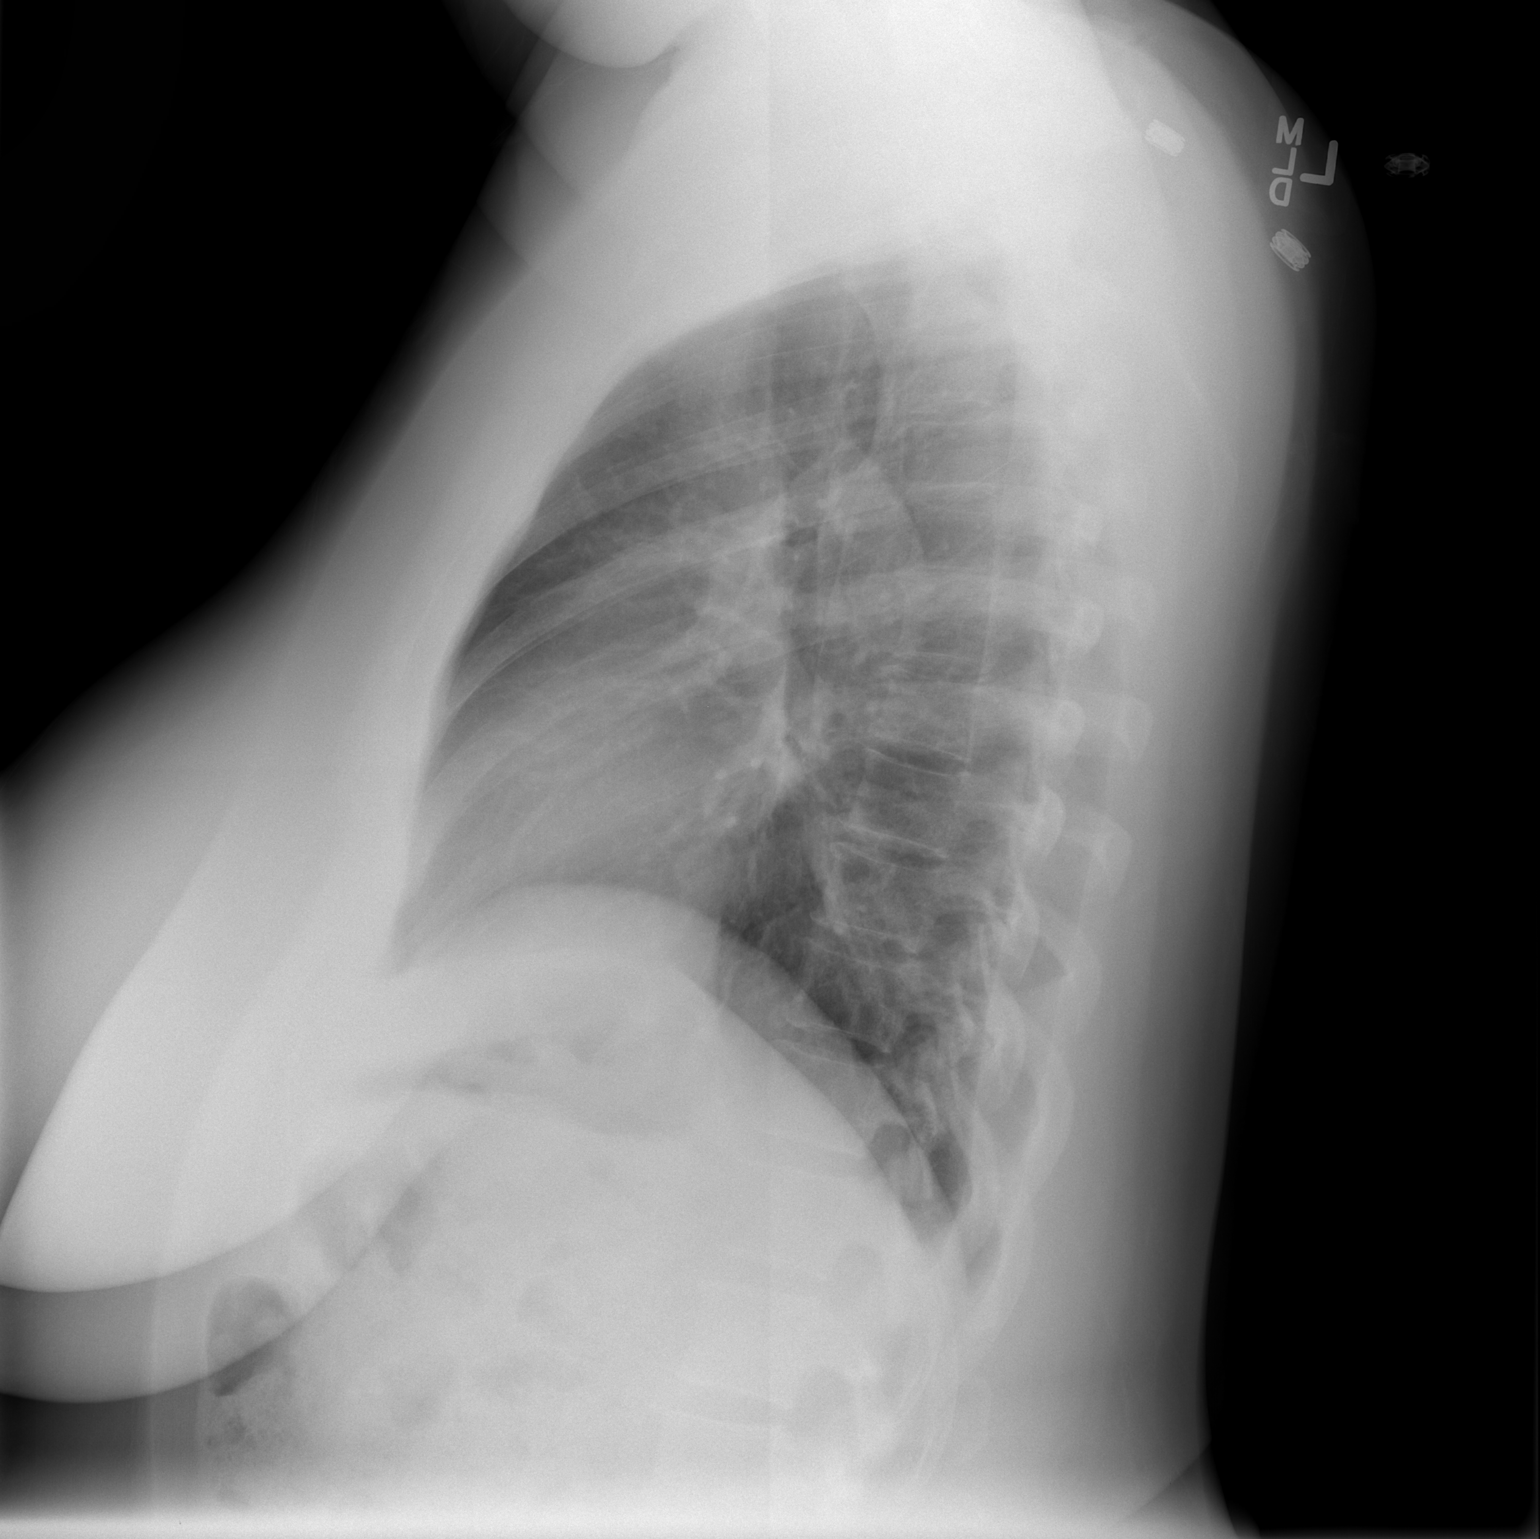

[2 of 2 positions shown; findings below may reference images not displayed]

FINDINGS: Cardiomediastinal silhouette is stable.  Mild thoracic
dextroscoliosis.  No acute infiltrate or pleural effusion.  No
pulmonary edema.  Mild degenerative changes thoracic spine.
IMPRESSION: No active disease.  Mild thoracic dextroscoliosis.

## 2012-03-21 ENCOUNTER — Other Ambulatory Visit (HOSPITAL_COMMUNITY): Payer: Self-pay | Admitting: Obstetrics and Gynecology

## 2012-03-21 DIAGNOSIS — Z1231 Encounter for screening mammogram for malignant neoplasm of breast: Secondary | ICD-10-CM

## 2012-03-31 ENCOUNTER — Ambulatory Visit (HOSPITAL_COMMUNITY)
Admission: RE | Admit: 2012-03-31 | Discharge: 2012-03-31 | Disposition: A | Discharge status: Home / Self Care | Payer: BC Managed Care – PPO | Source: Ambulatory Visit | Attending: Obstetrics and Gynecology | Admitting: Obstetrics and Gynecology

## 2012-03-31 DIAGNOSIS — Z1231 Encounter for screening mammogram for malignant neoplasm of breast: Secondary | ICD-10-CM

## 2012-08-26 ENCOUNTER — Other Ambulatory Visit: Payer: Self-pay | Admitting: Nurse Practitioner

## 2012-08-26 ENCOUNTER — Other Ambulatory Visit (HOSPITAL_COMMUNITY)
Admission: RE | Admit: 2012-08-26 | Discharge: 2012-08-26 | Disposition: A | Discharge status: Home / Self Care | Payer: BC Managed Care – PPO | Source: Ambulatory Visit | Attending: Obstetrics and Gynecology | Admitting: Obstetrics and Gynecology

## 2012-08-26 DIAGNOSIS — Z1151 Encounter for screening for human papillomavirus (HPV): Secondary | ICD-10-CM | POA: Insufficient documentation

## 2012-08-26 DIAGNOSIS — Z01419 Encounter for gynecological examination (general) (routine) without abnormal findings: Secondary | ICD-10-CM | POA: Insufficient documentation

## 2013-03-31 ENCOUNTER — Other Ambulatory Visit (HOSPITAL_COMMUNITY): Payer: Self-pay | Admitting: Obstetrics and Gynecology

## 2013-03-31 DIAGNOSIS — Z1231 Encounter for screening mammogram for malignant neoplasm of breast: Secondary | ICD-10-CM

## 2013-04-03 ENCOUNTER — Ambulatory Visit (HOSPITAL_COMMUNITY)
Admission: RE | Admit: 2013-04-03 | Discharge: 2013-04-03 | Disposition: A | Discharge status: Home / Self Care | Payer: BC Managed Care – PPO | Source: Ambulatory Visit | Attending: Obstetrics and Gynecology | Admitting: Obstetrics and Gynecology

## 2013-04-03 DIAGNOSIS — Z1231 Encounter for screening mammogram for malignant neoplasm of breast: Secondary | ICD-10-CM | POA: Insufficient documentation

## 2013-04-04 ENCOUNTER — Encounter: Payer: Self-pay | Admitting: Interventional Cardiology

## 2013-04-04 ENCOUNTER — Encounter: Payer: Self-pay | Admitting: *Deleted

## 2013-04-04 DIAGNOSIS — R519 Headache, unspecified: Secondary | ICD-10-CM | POA: Insufficient documentation

## 2013-04-04 DIAGNOSIS — E28319 Asymptomatic premature menopause: Secondary | ICD-10-CM | POA: Insufficient documentation

## 2013-04-04 DIAGNOSIS — R51 Headache: Secondary | ICD-10-CM

## 2013-04-04 DIAGNOSIS — J309 Allergic rhinitis, unspecified: Secondary | ICD-10-CM | POA: Insufficient documentation

## 2013-04-09 ENCOUNTER — Ambulatory Visit: Payer: BC Managed Care – PPO | Admitting: Cardiology

## 2013-04-12 ENCOUNTER — Encounter (HOSPITAL_COMMUNITY): Payer: Self-pay | Admitting: Emergency Medicine

## 2013-04-12 ENCOUNTER — Emergency Department (HOSPITAL_COMMUNITY)
Admission: EM | Admit: 2013-04-12 | Discharge: 2013-04-12 | Disposition: A | Discharge status: Home / Self Care | Payer: BC Managed Care – PPO | Attending: Emergency Medicine | Admitting: Emergency Medicine

## 2013-04-12 DIAGNOSIS — R002 Palpitations: Secondary | ICD-10-CM | POA: Insufficient documentation

## 2013-04-12 DIAGNOSIS — Z7982 Long term (current) use of aspirin: Secondary | ICD-10-CM | POA: Insufficient documentation

## 2013-04-12 DIAGNOSIS — Z8639 Personal history of other endocrine, nutritional and metabolic disease: Secondary | ICD-10-CM | POA: Insufficient documentation

## 2013-04-12 DIAGNOSIS — Z862 Personal history of diseases of the blood and blood-forming organs and certain disorders involving the immune mechanism: Secondary | ICD-10-CM | POA: Insufficient documentation

## 2013-04-12 NOTE — ED Provider Notes (Signed)
CSN: [id number]     Arrival date & time 04/12/13  2023 History   First MD Initiated Contact with Patient 04/12/13 2058     Chief Complaint  Patient presents with  . Palpitations    HPI Patient presents with palpitations.  She states she was in her normal state of health and developed some mild left-sided chest pain which is followed by a 2-3 minutes of palpitations.  He felt like her heart was "fluttering.  She is followed up are ready and been seen by cardiology regarding palpitations.  She just finished wearing her Holter monitor and does not have the results yet.  She is scheduled for laboratory studies tomorrow with cardiology.  She is scheduled to have an echocardiogram and a stress test.  Her pain was transient and now it is resolved.  Her palpitations were transient and also have resolved.  She did not check her pulse.  She continues to drink coffee.  She's never had syncope or near-syncope in regards to her palpitations.  No history of cardiac disease.  Her only cardiac risk factor is possible hyperlipidemia per the patient.  She does not smoke cigarettes.  She has a history of diabetes or hypertension.  November history of early cardiac disease.  Her symptoms are now resolved and she has no complaints.   Past Medical History  Diagnosis Date  . Allergic rhinitis   . Premature menopause   . Headache    Past Surgical History  Procedure Laterality Date  . Laminectomy    . Vertebroplasty    . Embolization     Family History  Problem Relation Age of Onset  . Cancer - Lung Mother   . Diabetes    . Hypertension    . Hyperlipidemia    . Cancer - Ovarian     History  Substance Use Topics  . Smoking status: Never Smoker   . Smokeless tobacco: Never Used  . Alcohol Use: 0.6 oz/week    1 Glasses of wine per week   OB History   Grav Para Term Preterm Abortions TAB SAB Ect Mult Living                 Review of Systems  All other systems reviewed and are negative.    Allergies   Pollen extract  Home Medications   Current Outpatient Rx  Name  Route  Sig  Dispense  Refill  . aspirin EC 81 MG tablet   Oral   Take 81 mg by mouth daily.         . Ibuprofen (EQ IBUPROFEN) 200 MG CAPS   Oral   Take by mouth.          BP 140/77  Pulse 81  Temp(Src) 98.1 F (36.7 C) (Oral)  Resp 20  SpO2 100% Physical Exam  Nursing note and vitals reviewed. Constitutional: She is oriented to person, place, and time. She appears well-developed and well-nourished. No distress.  HENT:  Head: Normocephalic and atraumatic.  Eyes: EOM are normal.  Neck: Normal range of motion.  Cardiovascular: Normal rate, regular rhythm and normal heart sounds.   Pulmonary/Chest: Effort normal and breath sounds normal.  Abdominal: Soft. She exhibits no distension. There is no tenderness.  Musculoskeletal: Normal range of motion.  Neurological: She is alert and oriented to person, place, and time.  Skin: Skin is warm and dry.  Psychiatric: She has a normal mood and affect. Judgment normal.    ED Course  Procedures (including critical  care time) Labs Review Labs Reviewed - No data to display Imaging Review No results found.  EKG Interpretation    Date/Time:  Sunday April 12 2013 20:52:48 EST Ventricular Rate:  78 PR Interval:  160 QRS Duration: 101 QT Interval:  368 QTC Calculation: 419 R Axis:   1 Text Interpretation:  Sinus rhythm RSR' in V1 or V2, right VCD or RVH No significant change was found Confirmed by Kerith Sherley  MD, Noell Shular ([id number]) on 04/12/2013 9:00:45 PM            MDM   1. Palpitations    Normal sinus rhythm today.  Patient is scheduled to have laboratory studies tomorrow and ongoing cardiology followup.  Her Holter is also to be interrogated tomorrow.  No additional workup is necessary in emergency apartment.  Vital signs are normal.  Doubt ACS.    Lyanne CoKevin M Zaccai Chavarin, MD 04/12/13 [phone]

## 2013-04-12 NOTE — ED Notes (Signed)
Pt a+ox4, presents with c/o palpitations, onset this evening.  Pt reports seeing cardiologist and wearing 48hr monitor for cardiac workup for similar symptoms.  Pt reports tonight also had instantaneous episode of shooting pain to L chest and palpitations were persisting longer than normal, "i got scared".  Pt denies pain at this time, reports still feeling "flutters".  NSR on monitor, no ectopy.  PP regular.  Pt denies sob, speaking full/clear sentences, rr even/un-lab.  Pt denies diaphoresis, n/v/d/c, fevers/chills, cough.  MAEI, amb with steady gait.  NAD.

## 2013-08-15 ENCOUNTER — Encounter (HOSPITAL_COMMUNITY): Payer: Self-pay | Admitting: Emergency Medicine

## 2013-08-15 ENCOUNTER — Emergency Department (HOSPITAL_COMMUNITY)
Admission: EM | Admit: 2013-08-15 | Discharge: 2013-08-15 | Disposition: A | Discharge status: Home / Self Care | Payer: BC Managed Care – PPO | Source: Home / Self Care

## 2013-08-15 DIAGNOSIS — J309 Allergic rhinitis, unspecified: Secondary | ICD-10-CM

## 2013-08-15 LAB — POCT RAPID STREP A: Streptococcus, Group A Screen (Direct): NEGATIVE

## 2013-08-15 MED ORDER — PREDNISONE 20 MG PO TABS: ORAL_TABLET | ORAL | Status: DC

## 2013-08-15 NOTE — ED Notes (Signed)
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 NAD

## 2013-08-15 NOTE — Discharge Instructions (Signed)
Allergic Rhinitis Allegra 180 mg for drainage If needed at night Chlor Trimeton 2 mg, may cause drowsiness Flonase nasal spray Saline nasal spray Lots of liquids Allergic rhinitis is when the mucous membranes in the nose respond to allergens. Allergens are particles in the air that cause your body to have an allergic reaction. This causes you to release allergic antibodies. Through a chain of events, these eventually cause you to release histamine into the blood stream. Although meant to protect the body, it is this release of histamine that causes your discomfort, such as frequent sneezing, congestion, and an itchy, runny nose.  CAUSES  Seasonal allergic rhinitis (hay fever) is caused by pollen allergens that may come from grasses, trees, and weeds. Year-round allergic rhinitis (perennial allergic rhinitis) is caused by allergens such as house dust mites, pet dander, and mold spores.  SYMPTOMS   Nasal stuffiness (congestion).  Itchy, runny nose with sneezing and tearing of the eyes. DIAGNOSIS  Your health care provider can help you determine the allergen or allergens that trigger your symptoms. If you and your health care provider are unable to determine the allergen, skin or blood testing may be used. TREATMENT  Allergic Rhinitis does not have a cure, but it can be controlled by:  Medicines and allergy shots (immunotherapy).  Avoiding the allergen. Hay fever may often be treated with antihistamines in pill or nasal spray forms. Antihistamines block the effects of histamine. There are over-the-counter medicines that may help with nasal congestion and swelling around the eyes. Check with your health care provider before taking or giving this medicine.  If avoiding the allergen or the medicine prescribed do not work, there are many new medicines your health care provider can prescribe. Stronger medicine may be used if initial measures are ineffective. Desensitizing injections can be used if  medicine and avoidance does not work. Desensitization is when a patient is given ongoing shots until the body becomes less sensitive to the allergen. Make sure you follow up with your health care provider if problems continue. HOME CARE INSTRUCTIONS It is not possible to completely avoid allergens, but you can reduce your symptoms by taking steps to limit your exposure to them. It helps to know exactly what you are allergic to so that you can avoid your specific triggers. SEEK MEDICAL CARE IF:   You have a fever.  You develop a cough that does not stop easily (persistent).  You have shortness of breath.  You start wheezing.  Symptoms interfere with normal daily activities. Document Released: 11/21/2000 Document Revised: 12/17/2012 Document Reviewed: 11/03/2012 [facility] Patient Information 2014 [place], [place].

## 2013-08-15 NOTE — ED Provider Notes (Signed)
Medical screening examination/treatment/procedure(s) were performed by a resident physician or non-physician practitioner and as the supervising physician I was immediately available for consultation/collaboration.  Katherine Tout, MD    Kamdyn Covel S Mousa Prout, MD 08/15/13 1818 

## 2013-08-15 NOTE — ED Provider Notes (Signed)
CSN: [id number]     Arrival date & time 08/15/13  1534 History   First MD Initiated Contact with Patient 08/15/13 1555     Chief Complaint  Patient presents with  . Sore Throat   (Consider location/radiation/quality/duration/timing/severity/associated sxs/prior Treatment) HPI Comments: 43 year old female developed shivering, fatigue and malaise with fever on May 24. The symptoms gradually abated but continued with PND, or change in voice, sneezing and intermittent bilateral earaches. She takes Claritin on most days.   Past Medical History  Diagnosis Date  . Allergic rhinitis   . Premature menopause   . Headache    Past Surgical History  Procedure Laterality Date  . Laminectomy    . Vertebroplasty    . Embolization     Family History  Problem Relation Age of Onset  . Cancer - Lung Mother   . Diabetes    . Hypertension    . Hyperlipidemia    . Cancer - Ovarian     History  Substance Use Topics  . Smoking status: Never Smoker   . Smokeless tobacco: Never Used  . Alcohol Use: 0.6 oz/week    1 Glasses of wine per week   OB History   Grav Para Term Preterm Abortions TAB SAB Ect Mult Living                 Review of Systems  Constitutional: Negative for fever, chills, activity change, appetite change and fatigue.  HENT: Positive for congestion, postnasal drip, rhinorrhea, sore throat and voice change. Negative for facial swelling.   Eyes: Negative.   Respiratory: Positive for cough. Negative for shortness of breath and wheezing.   Cardiovascular: Negative.   Gastrointestinal: Negative.   Musculoskeletal: Negative for neck pain and neck stiffness.  Skin: Negative for pallor and rash.  Neurological: Negative.     Allergies  Pollen extract  Home Medications   Prior to Admission medications   Medication Sig Start Date End Date Taking? Authorizing Provider  aspirin EC 81 MG tablet Take 81 mg by mouth daily.    Historical Provider, MD  Ibuprofen (EQ IBUPROFEN) 200 MG  CAPS Take by mouth.    Historical Provider, MD  predniSONE (DELTASONE) 20 MG tablet 2 tabs po once daily x 5 days 08/15/13   Hayden Rasmussen, NP   BP 141/89  Pulse 93  Temp(Src) 98 F (36.7 C) (Oral)  Resp 19  SpO2 99% Physical Exam  Nursing note and vitals reviewed. Constitutional: She is oriented to person, place, and time. She appears well-developed and well-nourished. No distress.  HENT:  Head: Normocephalic.  Mouth/Throat: No oropharyngeal exudate.  Minor erythema of the posterior oropharynx. More and streaky type fashion. No exudates or edema Bilateral TMs with mild retraction, no erythema or effusion.  Eyes: Conjunctivae and EOM are normal.  Neck: Normal range of motion. Neck supple.  Cardiovascular: Normal rate and regular rhythm.   Pulmonary/Chest: Effort normal. No respiratory distress. She has no wheezes. She has no rales.  Musculoskeletal: Normal range of motion.  Lymphadenopathy:    She has no cervical adenopathy.  Neurological: She is alert and oriented to person, place, and time. No cranial nerve deficit.  Skin: Skin is warm and dry.  Psychiatric: She has a normal mood and affect.    ED Course  Procedures (including critical care time) Labs Review Labs Reviewed  POCT RAPID STREP A ([facility]ARE ONLY)    Imaging Review No results found. Results for orders placed during the hospital encounter of 08/15/13  POCT RAPID STREP A ([facility] ONLY)      Result Value Ref Range   Streptococcus, Group A Screen (Direct) NEGATIVE  NEGATIVE     MDM   1. Allergic rhinitis    Prednisone 20 mg q d x 5 d Allegra 180 and other OTC meds written,  Lots of water    Hayden Rasmussenavid Dub Maclellan, NP 08/15/13 [phone]

## 2013-08-17 LAB — CULTURE, GROUP A STREP

## 2014-03-22 ENCOUNTER — Other Ambulatory Visit (HOSPITAL_COMMUNITY): Payer: Self-pay | Admitting: Obstetrics and Gynecology

## 2014-03-22 DIAGNOSIS — Z1231 Encounter for screening mammogram for malignant neoplasm of breast: Secondary | ICD-10-CM

## 2014-04-05 ENCOUNTER — Ambulatory Visit (HOSPITAL_COMMUNITY)
Admission: RE | Admit: 2014-04-05 | Discharge: 2014-04-05 | Disposition: A | Discharge status: Home / Self Care | Payer: BC Managed Care – PPO | Source: Ambulatory Visit | Attending: Obstetrics and Gynecology | Admitting: Obstetrics and Gynecology

## 2014-04-05 DIAGNOSIS — Z1231 Encounter for screening mammogram for malignant neoplasm of breast: Secondary | ICD-10-CM | POA: Diagnosis not present

## 2014-09-09 ENCOUNTER — Encounter: Payer: Self-pay | Admitting: Podiatry

## 2014-09-09 ENCOUNTER — Ambulatory Visit (INDEPENDENT_AMBULATORY_CARE_PROVIDER_SITE_OTHER): Payer: BC Managed Care – PPO | Admitting: Podiatry

## 2014-09-09 VITALS — BP 138/90 | HR 89 | Resp 12

## 2014-09-09 DIAGNOSIS — L603 Nail dystrophy: Secondary | ICD-10-CM

## 2014-09-09 NOTE — Progress Notes (Signed)
   Subjective:    Patient ID: Melanie RevereSharon W Benson, female    DOB: 1970/05/28, 44 y.o.   MRN: [id number]  HPI PT STATED RT FOOT GREAT TOENAIL AND HAVE DISCOLORATION FOR 2 MONTH. THE TOENAIL IS GETTING WORSE ESPECIALLY WHEN PUTTING PRESSURE ON IT. TRIED NO TREATMENT.   Review of Systems  Cardiovascular: Positive for leg swelling.  Skin: Positive for color change.  All other systems reviewed and are negative.      Objective:   Physical Exam: I have reviewed her past medical history medications allergy surgery social history. Pulses are strongly palpable bilateral. Neurologic sensorium is intact per Semmes-Weinstein monofilament. Deep tendon reflexes are intact bilateral muscle strength +5 over 5 dorsiflexion plantar flexors and inverters everters all of his musculature is intact. Orthopedic evaluation demonstrates all joints distal to the ankle for range of motion without crepitation. Cutaneous evaluation demonstrates thick dystrophic clinically mycotic nails with Malan onychia. Nail is moderately tender around the margins.        Assessment & Plan:  Assessment: Nail dystrophy with discoloration Rule out onychomycosis.  Plan: Samples of the nail and skin were taken today to be sent for pathologic evaluation. We'll follow up with her once this is returned.

## 2014-10-21 ENCOUNTER — Telehealth: Payer: Self-pay | Admitting: *Deleted

## 2014-10-21 NOTE — Telephone Encounter (Addendum)
Pt states she has not received a result call on her fungus test.  Deveron Furlong [phone] and DeeDee states she will fax now.  Informing pt Dr. Al Corpus would like to have pt make an appt to discuss treatment options.

## 2014-10-22 ENCOUNTER — Telehealth: Payer: Self-pay | Admitting: *Deleted

## 2014-10-22 NOTE — Telephone Encounter (Signed)
Pt states she received my message but it cost $70.00/appt and she would like to know the results. I told pt I would inform Dr. Al Corpus, but he would prefer to discuss the treatment options and side effect in person.  Pt states she will make an appt, and I transferred pt to schedulers.

## 2014-10-26 ENCOUNTER — Encounter: Payer: Self-pay | Admitting: Podiatry

## 2014-10-26 ENCOUNTER — Ambulatory Visit (INDEPENDENT_AMBULATORY_CARE_PROVIDER_SITE_OTHER): Payer: BC Managed Care – PPO | Admitting: Podiatry

## 2014-10-26 VITALS — BP 134/82 | HR 87 | Resp 16

## 2014-10-26 DIAGNOSIS — Z79899 Other long term (current) drug therapy: Secondary | ICD-10-CM

## 2014-10-26 DIAGNOSIS — L603 Nail dystrophy: Secondary | ICD-10-CM

## 2014-10-26 MED ORDER — TERBINAFINE HCL 250 MG PO TABS: 250.0000 mg | ORAL_TABLET | Freq: Every day | ORAL | Status: DC

## 2014-10-26 NOTE — Patient Instructions (Signed)

## 2014-10-26 NOTE — Progress Notes (Signed)
She presents today for history of onychomycosis and tinea pedis.  Objective: Vital signs are stable she is alert and oriented 13. 44 year old female presents by signs stable alert and oriented 3 pulses are strongly palpable bilateral. Neurologic sensorium is intact percent Semmes-Weinstein monofilament. Deep tendon reflexes are intact bilateral muscle strength is 5 over 5 dorsiflexion plantar flexors and inverters everters all intrinsic musculature is intact. Orthopedic evaluation of his result was distal to the ankle for range of motion without crepitation. Cutaneous evaluation does demonstrate onychomycosis and tinea pedis per [facility] labs.  Assessment: Onychomycosis and tinea pedis.  Plan: At this point we requested and liver profile and CBC. We prescribed Lamisil 250 mg tablets 1 by mouth daily 30 in number and will follow up with her in 1 month. At that time another liver profile will be performed and we will prescribe another 90 days medication.  Dr. Arbutus Ped

## 2014-10-27 LAB — CBC WITH DIFFERENTIAL/PLATELET
Basophils Absolute: 0 10*3/uL (ref 0.0–0.1)
Basophils Relative: 0 % (ref 0–1)
Eosinophils Absolute: 0.2 10*3/uL (ref 0.0–0.7)
Eosinophils Relative: 2 % (ref 0–5)
HCT: 40.2 % (ref 36.0–46.0)
Hemoglobin: 13.2 g/dL (ref 12.0–15.0)
Lymphocytes Relative: 35 % (ref 12–46)
Lymphs Abs: 3.2 10*3/uL (ref 0.7–4.0)
MCH: 28.1 pg (ref 26.0–34.0)
MCHC: 32.8 g/dL (ref 30.0–36.0)
MCV: 85.7 fL (ref 78.0–100.0)
MPV: 9.1 fL (ref 8.6–12.4)
Monocytes Absolute: 0.5 10*3/uL (ref 0.1–1.0)
Monocytes Relative: 5 % (ref 3–12)
Neutro Abs: 5.3 10*3/uL (ref 1.7–7.7)
Neutrophils Relative %: 58 % (ref 43–77)
Platelets: 370 10*3/uL (ref 150–400)
RBC: 4.69 MIL/uL (ref 3.87–5.11)
RDW: 14.4 % (ref 11.5–15.5)
WBC: 9.2 10*3/uL (ref 4.0–10.5)

## 2014-10-27 LAB — HEPATIC FUNCTION PANEL
ALT: 17 U/L (ref 6–29)
AST: 22 U/L (ref 10–30)
Albumin: 3.8 g/dL (ref 3.6–5.1)
Alkaline Phosphatase: 67 U/L (ref 33–115)
Bilirubin, Direct: 0.1 mg/dL (ref <=0.2)
Indirect Bilirubin: 0.3 mg/dL (ref 0.2–1.2)
Total Bilirubin: 0.4 mg/dL (ref 0.2–1.2)
Total Protein: 6.5 g/dL (ref 6.1–8.1)

## 2014-10-28 ENCOUNTER — Telehealth: Payer: Self-pay | Admitting: *Deleted

## 2014-10-28 ENCOUNTER — Encounter: Payer: Self-pay | Admitting: Podiatry

## 2014-10-28 NOTE — Telephone Encounter (Addendum)
-----   Message from Kristian Covey, [facility] sent at 10/28/2014  3:19 PM EDT -----   ----- Message -----    From: Elinor Parkinson, DPM    Sent: 10/27/2014   2:13 PM      To: [employer], [facility]  Blood work looks good and may continue medication.  Left message informing pt of Dr. Geryl Rankins recommendations.

## 2014-11-25 ENCOUNTER — Encounter: Payer: BC Managed Care – PPO | Admitting: Podiatry

## 2014-11-25 ENCOUNTER — Encounter: Payer: Self-pay | Admitting: Podiatry

## 2014-11-25 NOTE — Progress Notes (Signed)
This encounter was created in error - please disregard.

## 2015-02-21 ENCOUNTER — Other Ambulatory Visit: Payer: Self-pay

## 2015-02-21 DIAGNOSIS — Z1231 Encounter for screening mammogram for malignant neoplasm of breast: Secondary | ICD-10-CM

## 2015-03-01 ENCOUNTER — Ambulatory Visit (INDEPENDENT_AMBULATORY_CARE_PROVIDER_SITE_OTHER): Payer: BLUE CROSS/BLUE SHIELD | Admitting: Podiatry

## 2015-03-01 ENCOUNTER — Encounter: Payer: Self-pay | Admitting: Podiatry

## 2015-03-01 DIAGNOSIS — L603 Nail dystrophy: Secondary | ICD-10-CM

## 2015-03-01 DIAGNOSIS — Z79899 Other long term (current) drug therapy: Secondary | ICD-10-CM

## 2015-03-01 MED ORDER — TERBINAFINE HCL 250 MG PO TABS: 250.0000 mg | ORAL_TABLET | Freq: Every day | ORAL | Status: DC

## 2015-03-01 NOTE — Progress Notes (Signed)
She presents today after having completed her initial 30 days of Lamisil approximately 2 months ago. She states that he really did not seem to do anything but I was unable to purchase the remaining medication and I was not able to Blood work done because I had no insurance at that time. She states that her insurance has been reinstated.  Objective: Vital signs are stable she is alert and oriented 3. No change in her onychomycosis.  Assessment: Onychomycosis.  Plan: Prescribed another 90 pills or medication Lamisil 250 mg tablets 1 by mouth daily #90 and also requested a liver profile. I will follow-up with her in 4 months. We may need to consider laser therapy as well as she questioned this today.

## 2015-03-16 ENCOUNTER — Telehealth: Payer: Self-pay | Admitting: *Deleted

## 2015-03-16 NOTE — Telephone Encounter (Signed)
Pt states she forgot to get the paperwork for the liver function test.  I told pt it would [facility].

## 2015-04-11 ENCOUNTER — Ambulatory Visit
Admission: RE | Admit: 2015-04-11 | Discharge: 2015-04-11 | Disposition: A | Discharge status: Home / Self Care | Payer: BLUE CROSS/BLUE SHIELD | Source: Ambulatory Visit

## 2015-04-11 DIAGNOSIS — Z1231 Encounter for screening mammogram for malignant neoplasm of breast: Secondary | ICD-10-CM

## 2015-06-30 ENCOUNTER — Ambulatory Visit: Payer: BLUE CROSS/BLUE SHIELD | Admitting: Podiatry

## 2015-07-07 ENCOUNTER — Ambulatory Visit (INDEPENDENT_AMBULATORY_CARE_PROVIDER_SITE_OTHER): Payer: BLUE CROSS/BLUE SHIELD | Admitting: Podiatry

## 2015-07-07 ENCOUNTER — Encounter: Payer: Self-pay | Admitting: Podiatry

## 2015-07-07 DIAGNOSIS — L603 Nail dystrophy: Secondary | ICD-10-CM

## 2015-07-07 DIAGNOSIS — Z79899 Other long term (current) drug therapy: Secondary | ICD-10-CM | POA: Diagnosis not present

## 2015-07-07 LAB — HEPATIC FUNCTION PANEL
ALT: 16 U/L (ref 6–29)
AST: 14 U/L (ref 10–35)
Albumin: 4.2 g/dL (ref 3.6–5.1)
Alkaline Phosphatase: 62 U/L (ref 33–115)
Bilirubin, Direct: 0.1 mg/dL (ref <=0.2)
Indirect Bilirubin: 0.2 mg/dL (ref 0.2–1.2)
Total Bilirubin: 0.3 mg/dL (ref 0.2–1.2)
Total Protein: 6.7 g/dL (ref 6.1–8.1)

## 2015-07-07 MED ORDER — TERBINAFINE HCL 250 MG PO TABS: 250.0000 mg | ORAL_TABLET | Freq: Every day | ORAL | Status: DC

## 2015-07-07 NOTE — Progress Notes (Signed)
She presents today after having completed 120 days of Lamisil therapy. States that her toenails are improving but she's not sure that they will grow out completely healed.  Objective: Vital signs are stable she is alert and oriented 3 onychomycosis is still present to the hallux nail plates bilateral.  Assessment: Onychomycosis.  Plan: I am encouraging her to start Lamisil pulse dose 1 tablet every other day for the next 2 months. I will follow-up with her in 3 months. We also requested more blood work today.

## 2015-07-12 ENCOUNTER — Telehealth: Payer: Self-pay | Admitting: *Deleted

## 2015-07-12 NOTE — Telephone Encounter (Addendum)
-----   Message from Elinor ParkinsonMax T Hyatt, [place] sent at 07/10/2015  9:16 PM EDT ----- Blood work looks good and may continue medication.  07/12/2015-Informed pt of Dr. Geryl RankinsHyatt's orders.

## 2015-10-06 ENCOUNTER — Ambulatory Visit: Payer: BLUE CROSS/BLUE SHIELD | Admitting: Podiatry

## 2015-10-27 ENCOUNTER — Ambulatory Visit: Payer: BLUE CROSS/BLUE SHIELD | Admitting: Podiatry

## 2015-11-10 ENCOUNTER — Other Ambulatory Visit: Payer: Self-pay | Admitting: Nurse Practitioner

## 2015-11-10 ENCOUNTER — Other Ambulatory Visit (HOSPITAL_COMMUNITY)
Admission: RE | Admit: 2015-11-10 | Discharge: 2015-11-10 | Disposition: A | Discharge status: Home / Self Care | Payer: BLUE CROSS/BLUE SHIELD | Source: Ambulatory Visit | Attending: Nurse Practitioner | Admitting: Nurse Practitioner

## 2015-11-10 DIAGNOSIS — Z1151 Encounter for screening for human papillomavirus (HPV): Secondary | ICD-10-CM | POA: Diagnosis not present

## 2015-11-10 DIAGNOSIS — Z01419 Encounter for gynecological examination (general) (routine) without abnormal findings: Secondary | ICD-10-CM | POA: Insufficient documentation

## 2015-11-11 LAB — CYTOLOGY - PAP

## 2016-03-07 ENCOUNTER — Other Ambulatory Visit: Payer: Self-pay | Admitting: Obstetrics and Gynecology

## 2016-03-07 DIAGNOSIS — Z1231 Encounter for screening mammogram for malignant neoplasm of breast: Secondary | ICD-10-CM

## 2016-04-13 ENCOUNTER — Ambulatory Visit
Admission: RE | Admit: 2016-04-13 | Discharge: 2016-04-13 | Disposition: A | Discharge status: Home / Self Care | Payer: BLUE CROSS/BLUE SHIELD | Source: Ambulatory Visit | Attending: Obstetrics and Gynecology | Admitting: Obstetrics and Gynecology

## 2016-04-13 DIAGNOSIS — Z1231 Encounter for screening mammogram for malignant neoplasm of breast: Secondary | ICD-10-CM

## 2017-04-08 ENCOUNTER — Other Ambulatory Visit: Payer: Self-pay | Admitting: Obstetrics and Gynecology

## 2017-04-08 DIAGNOSIS — Z139 Encounter for screening, unspecified: Secondary | ICD-10-CM

## 2017-04-26 ENCOUNTER — Ambulatory Visit
Admission: RE | Admit: 2017-04-26 | Discharge: 2017-04-26 | Disposition: A | Discharge status: Home / Self Care | Payer: BLUE CROSS/BLUE SHIELD | Source: Ambulatory Visit | Attending: Obstetrics and Gynecology | Admitting: Obstetrics and Gynecology

## 2017-04-26 DIAGNOSIS — Z139 Encounter for screening, unspecified: Secondary | ICD-10-CM

## 2018-02-04 ENCOUNTER — Other Ambulatory Visit (HOSPITAL_COMMUNITY)
Admission: RE | Admit: 2018-02-04 | Discharge: 2018-02-04 | Disposition: A | Discharge status: Home / Self Care | Payer: BLUE CROSS/BLUE SHIELD | Source: Ambulatory Visit | Attending: Nurse Practitioner | Admitting: Nurse Practitioner

## 2018-02-04 ENCOUNTER — Other Ambulatory Visit: Payer: Self-pay | Admitting: Nurse Practitioner

## 2018-02-04 DIAGNOSIS — Z01419 Encounter for gynecological examination (general) (routine) without abnormal findings: Secondary | ICD-10-CM | POA: Insufficient documentation

## 2018-02-07 LAB — CYTOLOGY - PAP
Diagnosis: NEGATIVE
HPV: NOT DETECTED

## 2018-04-02 ENCOUNTER — Other Ambulatory Visit: Payer: Self-pay | Admitting: Nurse Practitioner

## 2018-04-02 DIAGNOSIS — Z1231 Encounter for screening mammogram for malignant neoplasm of breast: Secondary | ICD-10-CM

## 2018-04-18 ENCOUNTER — Encounter: Payer: Self-pay | Admitting: *Deleted

## 2018-04-22 ENCOUNTER — Ambulatory Visit: Payer: BLUE CROSS/BLUE SHIELD | Admitting: Neurology

## 2018-04-22 ENCOUNTER — Encounter: Payer: Self-pay | Admitting: Neurology

## 2018-04-22 VITALS — BP 114/74 | HR 62 | Ht 63.75 in | Wt 252.0 lb

## 2018-04-22 DIAGNOSIS — R519 Headache, unspecified: Secondary | ICD-10-CM | POA: Insufficient documentation

## 2018-04-22 DIAGNOSIS — R51 Headache: Secondary | ICD-10-CM | POA: Diagnosis not present

## 2018-04-22 MED ORDER — RIZATRIPTAN BENZOATE 5 MG PO TBDP: 5.0000 mg | ORAL_TABLET | ORAL | 6 refills | Status: DC | PRN

## 2018-04-22 MED ORDER — GABAPENTIN 300 MG PO CAPS: 300.0000 mg | ORAL_CAPSULE | Freq: Two times a day (BID) | ORAL | 4 refills | Status: DC

## 2018-04-22 NOTE — Progress Notes (Signed)
PATIENT: Melanie RevereSharon W Benson DOB: 01-Mar-1971  Chief Complaint  Patient presents with  . Pain    Reports intermittent, shooting pains on the right-side of her head that radiates into her neck.  She also has tingling in her scalp and a fullness sensation in her neck.  She is getting some relief with gabapentin 300mg  BID.  She has been struggling with daytime drowsiness with gabapentin.   Marland Kitchen. PCP    Melanie LundBecker, Melanie G, PA     HISTORICAL  Melanie Benson is a 48 years old female, seen in request by his primary care PA Melanie MarshallBecker, Melanie for evaluation of pain on the right side of her head radiating to her neck, sensitivity in that area, initial evaluation was on April 22, 2018.  I have reviewed and summarized the referring note from the referring physician.  She has past medical history of hypertension, hyperlipidemia diabetes, acid reflux.  History of lumbar compression surgery due to L5 hemangioma with significant bony expansion, including posterior elements, with almost complete obliteration of the thecal sac at the level, and bilateral foraminal narrowing.  She reported a long history of migraine headaches, her typical migraine are lateralized severe pounding headache with associated light noise sensitivity, nauseous, lasting for couple hours, relieved by lying down in dark quiet room, Excedrin Migraine as needed, she only has her typical migraine headaches couple times each year.  She began to have different headaches since October 2019, is more frequent, couple times a week, shorter lasting, lasting 30 minutes, different kind of pain, it is feel like ice pricking into her right eyeball, slowly pushing, sharp pain, sometimes her right skull become sensitive to touch, there is no significant light noise sensitivity, resolved by taking NSAIDs as needed, trigger for her headaches are stress.  REVIEW OF SYSTEMS: Full 14 system review of systems performed and notable only for headaches, snoring, anxiety,  not enough sleep, snoring, eye pain, ringing in ears All other review of systems were negative.  ALLERGIES: Allergies  Allergen Reactions  . Lisinopril Cough  . Pollen Extract   . Simvastatin Other (See Comments)    "felt sick"    HOME MEDICATIONS: Current Outpatient Medications  Medication Sig Dispense Refill  . aspirin EC 81 MG tablet Take 81 mg by mouth daily.    Marland Kitchen. gabapentin (NEURONTIN) 300 MG capsule Take 300 mg by mouth 2 (two) times daily.    . IBUPROFEN PO Take 200 mg by mouth as needed.    . rosuvastatin (CRESTOR) 10 MG tablet Take 10 mg by mouth daily.    . valsartan-hydrochlorothiazide (DIOVAN-HCT) 160-12.5 MG tablet Take 1 tablet by mouth daily.     No current facility-administered medications for this visit.     PAST MEDICAL HISTORY: Past Medical History:  Diagnosis Date  . Allergic rhinitis   . Diabetes (HCC)   . GERD (gastroesophageal reflux disease)   . Head pain   . Headache   . Hyperlipemia   . Hypertension   . Premature menopause     PAST SURGICAL HISTORY: Past Surgical History:  Procedure Laterality Date  . CESAREAN SECTION    . EMBOLIZATION    . LAMINECTOMY    . OPEN NEEDLE BIOPSY SPINE    . VERTEBROPLASTY      FAMILY HISTORY: Family History  Problem Relation Age of Onset  . Cancer - Lung Mother   . Diabetes Other   . Hypertension Other   . Hyperlipidemia Other   . Cancer - Ovarian  Other   . Hyperlipidemia Maternal Grandmother   . Hypertension Maternal Grandmother   . Cervical cancer Maternal Grandmother   . Kidney failure Maternal Grandmother   . Hypertension Maternal Grandfather   . Diabetes Paternal Grandmother   . Hypertension Paternal Grandmother   . Breast cancer Paternal Grandmother   . Kidney failure Paternal Grandmother   . Heart failure Father   . Sarcoidosis Father     SOCIAL HISTORY: Social History   Socioeconomic History  . Marital status: Divorced    Spouse name: Not on file  . Number of children: 1  . Years  of education: college  . Highest education level: Master's degree (e.g., MA, MS, MEng, MEd, MSW, MBA)  Occupational History  . Occupation: Insurance account managerconstruction project manager  Social Needs  . Financial resource strain: Not on file  . Food insecurity:    Worry: Not on file    Inability: Not on file  . Transportation needs:    Medical: Not on file    Non-medical: Not on file  Tobacco Use  . Smoking status: Never Smoker  . Smokeless tobacco: Never Used  Substance and Sexual Activity  . Alcohol use: Yes    Comment: rare  . Drug use: No  . Sexual activity: Not on file  Lifestyle  . Physical activity:    Days per week: Not on file    Minutes per session: Not on file  . Stress: Not on file  Relationships  . Social connections:    Talks on phone: Not on file    Gets together: Not on file    Attends religious service: Not on file    Active member of club or organization: Not on file    Attends meetings of clubs or organizations: Not on file    Relationship status: Not on file  . Intimate partner violence:    Fear of current or ex partner: Not on file    Emotionally abused: Not on file    Physically abused: Not on file    Forced sexual activity: Not on file  Other Topics Concern  . Not on file  Social History Narrative   Lives at home with daughter and niece.   Right-handed.   1 cup caffeine per day.     PHYSICAL EXAM   Vitals:   04/22/18 0755  BP: 114/74  Pulse: 62  Weight: 252 lb (114.3 kg)  Height: 5' 3.75" (1.619 m)    Not recorded      Body mass index is 43.6 kg/m.  PHYSICAL EXAMNIATION:  Gen: NAD, conversant, well nourised, obese, well groomed                     Cardiovascular: Regular rate rhythm, no peripheral edema, warm, nontender. Eyes: Conjunctivae clear without exudates or hemorrhage Neck: Supple, no carotid bruits. Pulmonary: Clear to auscultation bilaterally   NEUROLOGICAL EXAM:  MENTAL STATUS: Speech:    Speech is normal; fluent and  spontaneous with normal comprehension.  Cognition:     Orientation to time, place and person     Normal recent and remote memory     Normal Attention span and concentration     Normal Language, naming, repeating,spontaneous speech     Fund of knowledge   CRANIAL NERVES: CN II: Visual fields are full to confrontation. Fundoscopic exam is normal with sharp discs and no vascular changes. Pupils are round equal and briskly reactive to light. CN III, IV, VI: extraocular movement are normal.  No ptosis. CN V: Facial sensation is intact to pinprick in all 3 divisions bilaterally. Corneal responses are intact.  CN VII: Face is symmetric with normal eye closure and smile. CN VIII: Hearing is normal to rubbing fingers CN IX, X: Palate elevates symmetrically. Phonation is normal. CN XI: Head turning and shoulder shrug are intact CN XII: Tongue is midline with normal movements and no atrophy.  MOTOR: There is no pronator drift of out-stretched arms. Muscle bulk and tone are normal. Muscle strength is normal.  REFLEXES: Reflexes are 2+ and symmetric at the biceps, triceps, knees, and ankles. Plantar responses are flexor.  SENSORY: Intact to light touch, pinprick, positional sensation and vibratory sensation are intact in fingers and toes.  COORDINATION: Rapid alternating movements and fine finger movements are intact. There is no dysmetria on finger-to-nose and heel-knee-shin.    GAIT/STANCE: Posture is normal. Gait is steady with normal steps, base, arm swing, and turning. Heel and toe walking are normal. Tandem gait is normal.  Romberg is absent.   DIAGNOSTIC DATA (LABS, IMAGING, TESTING) - I reviewed patient records, labs, notes, testing and imaging myself where available.   ASSESSMENT AND PLAN  Melanie Benson is a 48 y.o. female   Chronic migraine headaches New onset right retro-orbital area piercing pain,  Discussed with patient, she desires maging study to rule out structural  lesion,  Proceed with MRI of brain  Maxalt as needed   Levert Feinstein, M.D. Ph.D.  [facility] [address], [address] [place], [place] [address] Ph: [phone] Fax: [phone]  CC: Melanie Lund, [place]

## 2018-04-23 ENCOUNTER — Telehealth: Payer: Self-pay | Admitting: Neurology

## 2018-04-23 NOTE — Telephone Encounter (Signed)
MR Brain wo contrast Dr. Mertie Clause Auth: [id number] (exp. 04/23/18 to 05/22/18). Patient is scheduled at [facility] for 04/29/18

## 2018-04-29 ENCOUNTER — Ambulatory Visit: Payer: BLUE CROSS/BLUE SHIELD

## 2018-04-29 ENCOUNTER — Ambulatory Visit
Admission: RE | Admit: 2018-04-29 | Discharge: 2018-04-29 | Disposition: A | Discharge status: Home / Self Care | Payer: BLUE CROSS/BLUE SHIELD | Source: Ambulatory Visit | Attending: Nurse Practitioner | Admitting: Nurse Practitioner

## 2018-04-29 DIAGNOSIS — R51 Headache: Secondary | ICD-10-CM

## 2018-04-29 DIAGNOSIS — Z1231 Encounter for screening mammogram for malignant neoplasm of breast: Secondary | ICD-10-CM

## 2018-04-29 DIAGNOSIS — R519 Headache, unspecified: Secondary | ICD-10-CM

## 2018-05-01 ENCOUNTER — Telehealth: Payer: Self-pay | Admitting: *Deleted

## 2018-05-01 NOTE — Telephone Encounter (Signed)
-----   Message from Levert Feinstein, MD sent at 05/01/2018 11:21 AM EST ----- Please call pt for normal MRI brain.

## 2018-05-01 NOTE — Telephone Encounter (Signed)
Spoke to patient - she is aware of her MRI results.  

## 2018-06-18 ENCOUNTER — Telehealth: Payer: Self-pay

## 2018-06-18 NOTE — Telephone Encounter (Signed)
Unable to get in contact with the patient to convert their office visit into a webex or telephone visit. I left them a voicemail asking to return my call. Office number was provided.   

## 2018-06-23 ENCOUNTER — Ambulatory Visit (INDEPENDENT_AMBULATORY_CARE_PROVIDER_SITE_OTHER): Payer: BLUE CROSS/BLUE SHIELD | Admitting: Neurology

## 2018-06-23 ENCOUNTER — Encounter: Payer: Self-pay | Admitting: Neurology

## 2018-06-23 ENCOUNTER — Other Ambulatory Visit: Payer: Self-pay

## 2018-06-23 ENCOUNTER — Ambulatory Visit: Payer: BLUE CROSS/BLUE SHIELD | Admitting: Neurology

## 2018-06-23 DIAGNOSIS — R519 Headache, unspecified: Secondary | ICD-10-CM

## 2018-06-23 DIAGNOSIS — R51 Headache: Secondary | ICD-10-CM

## 2018-06-23 NOTE — Telephone Encounter (Signed)
Pt has call and given verbal consent to file insurance for a Virtual Visit today @ 3:15pm. Pt email: [email or web address] pt asked to download [employer] webex app/program [email or web address]

## 2018-06-23 NOTE — Telephone Encounter (Signed)
Patient has been sent a email with instructions on how to set up her webex meeting.

## 2018-06-23 NOTE — Progress Notes (Signed)
Virtual Visit via Video Note  I connected with Melanie Benson on 06/23/18 at  3:15 PM EDT by a video enabled telemedicine application and verified that I am speaking with the correct person using two identifiers.   I discussed the limitations of evaluation and management by telemedicine and the availability of in person appointments. The patient expressed understanding and agreed to proceed.  History of Present Illness: 04/22/2018 Dr. Marney Setting Benson is a 48 years old female, seen in request by his primary care PA Melanie Benson for evaluation of pain on the right side of her head radiating to her neck, sensitivity in that area, initial evaluation was on April 22, 2018.  I have reviewed and summarized the referring note from the referring physician.  She has past medical history of hypertension, hyperlipidemia diabetes, acid reflux.  History of lumbar compression surgery due to L5 hemangioma with significant bony expansion, including posterior elements, with almost complete obliteration of the thecal sac at the level, and bilateral foraminal narrowing.  She reported a long history of migraine headaches, her typical migraine are lateralized severe pounding headache with associated light noise sensitivity, nauseous, lasting for couple hours, relieved by lying down in dark quiet room, Excedrin Migraine as needed, she only has her typical migraine headaches couple times each year.  She began to have different headaches since October 2019, is more frequent, couple times a week, shorter lasting, lasting 30 minutes, different kind of pain, it is feel like ice pricking into her right eyeball, slowly pushing, sharp pain, sometimes her right skull become sensitive to touch, there is no significant light noise sensitivity, resolved by taking NSAIDs as needed, trigger for her headaches are stress.  June 23, 2018 SS: She had MRI of the brain in February 2020 was normal.  She is only able to take  gabapentin 300 mg once daily, taking twice daily made her too drowsy.  She has not had to take the Maxalt.  She reports that since her last visit her headaches are much better, attributes this to getting more sleep, less stress, forced to slow down with the coronavirus pandemic.  She reports since her last visit she is only had about 2 headaches.  She denies any changes to her medical history.  She denies any new problems or concerns.   Observations/Objective: Alert, follows commands, speech is clear and concise, facial symmetry noted, gait is intact, no arm drift  Assessment and Plan: 1.  Migraine headaches  Overall she is doing much better.  She reports she has only had about 2 headaches since her last visit.  She has not had to take the Maxalt.  She thinks her headaches are better because she is sleeping more, less stress in her life.  I advised her that if her symptoms worsen or she develops any new symptoms she should let us know.  She will continue taking gabapentin 300 mg at bedtime.  Follow Up Instructions: She will follow-up in 6 months for revisit   I discussed the assessment and treatment plan with the patient. The patient was provided an opportunity to ask questions and all were answered. The patient agreed with the plan and demonstrated an understanding of the instructions.   The patient was advised to call back or seek an in-person evaluation if the symptoms worsen or if the condition fails to improve as anticipated.  I provided 20 minutes of non-face-to-face time during this encounter.   Margie Ege, Edrick Oh, DNP  [facility] [phone]  [address] [place], [place] [address] [phone]

## 2018-06-24 NOTE — Progress Notes (Signed)
I have reviewed and agreed above plan. 

## 2018-07-24 ENCOUNTER — Telehealth: Payer: Self-pay | Admitting: *Deleted

## 2018-07-24 NOTE — Telephone Encounter (Signed)
LMVM for pt to return call to set up 6  Month RV with ss/NP [place], headaches. If she call back ok to schedule.

## 2018-09-16 ENCOUNTER — Other Ambulatory Visit: Payer: Self-pay | Admitting: Internal Medicine

## 2018-09-16 DIAGNOSIS — Z20822 Contact with and (suspected) exposure to covid-19: Secondary | ICD-10-CM

## 2018-09-21 LAB — NOVEL CORONAVIRUS, NAA: SARS-CoV-2, NAA: DETECTED — AB

## 2018-09-22 ENCOUNTER — Telehealth: Payer: Self-pay

## 2018-09-22 NOTE — Telephone Encounter (Signed)
Pt. Called back and given COVID 19 results. 

## 2018-09-22 NOTE — Telephone Encounter (Signed)
Pt. States she feels better. Has not had a fever. Will quarantine 10 days from beginning of symptoms. Will notify her PCP. Reviewed safety points for home. Keep distance from other family members, cleaning hard surfaces and using good handwashing.

## 2018-12-22 NOTE — Progress Notes (Signed)
PATIENT: Melanie Benson DOB: [birth year]  REASON FOR VISIT: follow up HISTORY FROM: patient  HISTORY OF PRESENT ILLNESS: Today 12/23/18  HISTORY  04/22/2018 Dr. Thersa Salt Johnsonis a 48 years old female, seen in request byhis primary care PABecker, Annafor evaluation of pain on the right side of her head radiating to her neck, sensitivity in that area, initial evaluation was on April 22, 2018.  I have reviewed and summarized the referring note from the referring physician.She has past medical history of hypertension, hyperlipidemia diabetes, acid reflux. History of lumbar compression surgery due to L5 hemangioma with significant bony expansion, including posterior elements, with almost complete obliteration of the thecal sac at the level, and bilateral foraminal narrowing.  She reported a long history of migraine headaches, her typical migraine are lateralized severe pounding headache with associated light noise sensitivity, nauseous, lasting for couple hours, relieved by lying down in dark quiet room, Excedrin Migraine as needed, she only has her typical migraine headaches couple times each year.  She began to have different headaches since October 2019, is more frequent, couple times a week, shorter lasting, lasting 30 minutes, different kind of pain, it is feel like ice pricking into her right eyeball, slowly pushing, sharp pain, sometimes her right skull become sensitive to touch, there is no significant light noise sensitivity, resolved by taking NSAIDs as needed, trigger for her headaches are stress.  June 23, 2018 SS: She had MRI of the brain in February 2020 was normal.  She is only able to take gabapentin 300 mg once daily, taking twice daily made her too drowsy.  She has not had to take the Maxalt.  She reports that since her last visit her headaches are much better, attributes this to getting more sleep, less stress, forced to slow down with the coronavirus  pandemic.  She reports since her last visit she is only had about 2 headaches.  She denies any changes to her medical history.  She denies any new problems or concerns  December 23, 2018 SS: She continues to benefit from switching jobs.  She is working at [employer], dealing with students.  She continues to report less stress, sleeping more, able to spend more time with her daughter.  Her headaches are now rare.  She no longer has a feeling of tingling on the right side of her face.  She is no longer taking gabapentin.  She has not had to take Maxalt.  She may have an occasional mild headache that she will take Tylenol or Aleve.  Her blood sugars are doing much better, she will likely be able to discontinue metformin.  She presents today for follow-up unaccompanied.  REVIEW OF SYSTEMS: Out of a complete 14 system review of symptoms, the patient complains only of the following symptoms, and all other reviewed systems are negative.  Headache  ALLERGIES: Allergies  Allergen Reactions   Lisinopril Cough   Pollen Extract    Simvastatin Other (See Comments)    "felt sick"    HOME MEDICATIONS: Outpatient Medications Prior to Visit  Medication Sig Dispense Refill   aspirin EC 81 MG tablet Take 81 mg by mouth daily.     gabapentin (NEURONTIN) 300 MG capsule Take 1 capsule (300 mg total) by mouth 2 (two) times daily. 180 capsule 4   IBUPROFEN PO Take 200 mg by mouth as needed.     metFORMIN (GLUCOPHAGE-XR) 500 MG 24 hr tablet Take 500 mg by mouth daily.  rizatriptan (MAXALT-MLT) 5 MG disintegrating tablet Take 1 tablet (5 mg total) by mouth as needed. May repeat in 2 hours if needed 15 tablet 6   rosuvastatin (CRESTOR) 10 MG tablet Take 10 mg by mouth daily.     valsartan-hydrochlorothiazide (DIOVAN-HCT) 160-12.5 MG tablet Take 1 tablet by mouth daily.     No facility-administered medications prior to visit.     PAST MEDICAL HISTORY: Past Medical History:  Diagnosis Date   Allergic  rhinitis    Diabetes (HCC)    GERD (gastroesophageal reflux disease)    Head pain    Headache    Hyperlipemia    Hypertension    Premature menopause     PAST SURGICAL HISTORY: Past Surgical History:  Procedure Laterality Date   CESAREAN SECTION     EMBOLIZATION     LAMINECTOMY     OPEN NEEDLE BIOPSY SPINE     VERTEBROPLASTY      FAMILY HISTORY: Family History  Problem Relation Age of Onset   Cancer - Lung Mother    Diabetes Other    Hypertension Other    Hyperlipidemia Other    Cancer - Ovarian Other    Hyperlipidemia Maternal Grandmother    Hypertension Maternal Grandmother    Cervical cancer Maternal Grandmother    Kidney failure Maternal Grandmother    Hypertension Maternal Grandfather    Diabetes Paternal Grandmother    Hypertension Paternal Grandmother    Breast cancer Paternal Grandmother    Kidney failure Paternal Grandmother    Heart failure Father    Sarcoidosis Father     SOCIAL HISTORY: Social History   Socioeconomic History   Marital status: Divorced    Spouse name: Not on file   Number of children: 1   Years of education: college   Highest education level: Master's degree (e.g., MA, MS, MEng, MEd, MSW, MBA)  Occupational History   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   Food insecurity    Worry: Not on file    Inability: Not on file   Transportation needs    Medical: Not on file    Non-medical: Not on file  Tobacco Use   Smoking status: Never Smoker   Smokeless tobacco: Never Used  Substance and Sexual Activity   Alcohol use: Yes    Comment: rare   Drug use: No   Sexual activity: Not on file  Lifestyle   Physical activity    Days per week: Not on file    Minutes per session: Not on file   Stress: Not on file  Relationships   Social connections    Talks on phone: Not on file    Gets together: Not on file    Attends religious  service: Not on file    Active member of club or organization: Not on file    Attends meetings of clubs or organizations: Not on file    Relationship status: Not on file   Intimate partner violence    Fear of current or ex partner: Not on file    Emotionally abused: Not on file    Physically abused: Not on file    Forced sexual activity: Not on file  Other Topics Concern   Not on file  Social History Narrative   Lives at home with daughter and niece.   Right-handed.   1 cup caffeine per day.      PHYSICAL EXAM  Vitals:   12/23/18 1511  BP: 112/75  Pulse: (!) 109  Temp: (!) 97.4 F (36.3 C)  TempSrc: Oral  Weight: 254 lb 9.6 oz (115.5 kg)  Height: 5' 3" (1.6 m)   Body mass index is 45.1 kg/m.  Generalized: Well developed, in no acute distress   Neurological examination  Mentation: Alert oriented to time, place, history taking. Follows all commands speech and language fluent Cranial nerve II-XII: Pupils were equal round reactive to light. Extraocular movements were full, visual field were full on confrontational test. Facial sensation and strength were normal.  Head turning and shoulder shrug  were normal and symmetric. Motor: The motor testing reveals 5 over 5 strength of all 4 extremities. Good symmetric motor tone is noted throughout.  Sensory: Sensory testing is intact to soft touch on all 4 extremities. No evidence of extinction is noted.  Coordination: Cerebellar testing reveals good finger-nose-finger and heel-to-shin bilaterally.  Gait and station: Gait is normal.  Reflexes: Deep tendon reflexes are symmetric and normal bilaterally.   DIAGNOSTIC DATA (LABS, IMAGING, TESTING) - I reviewed patient records, labs, notes, testing and imaging myself where available.  Lab Results  Component Value Date   WBC 9.2 10/26/2014   HGB 13.2 10/26/2014   HCT 40.2 10/26/2014   MCV 85.7 10/26/2014   PLT 370 10/26/2014      Component Value Date/Time   NA 140 01/14/2010  1220   K 4.0 01/14/2010 1220   CL 105 01/14/2010 1220   CO2 29 01/14/2010 1220   GLUCOSE 108 (H) 01/14/2010 1220   BUN 12 01/14/2010 1220   CREATININE 0.85 01/14/2010 1220   CALCIUM 9.1 01/14/2010 1220   PROT 6.7 07/07/2015 1227   ALBUMIN 4.2 07/07/2015 1227   AST 14 07/07/2015 1227   ALT 16 07/07/2015 1227   ALKPHOS 62 07/07/2015 1227   BILITOT 0.3 07/07/2015 1227   GFRNONAA >60 01/14/2010 1220   GFRAA  01/14/2010 1220    >60        The eGFR has been calculated using the MDRD equation. This calculation has not been validated in all clinical situations. eGFR's persistently <60 mL/min signify possible Chronic Kidney Disease.   No results found for: CHOL, HDL, LDLCALC, LDLDIRECT, TRIG, CHOLHDL No results found for: HGBA1C No results found for: VITAMINB12 No results found for: TSH   ASSESSMENT AND PLAN 48 y.o. year old female  has a past medical history of Allergic rhinitis, Diabetes ([place]), GERD (gastroesophageal reflux disease), Head pain, Headache, Hyperlipemia, Hypertension, and Premature menopause. here with:  1.  Chronic migraine headache -Her headaches have improved tremendously, she rarely has a headache now, likely due to decreased stress, switching jobs -She is no longer taking gabapentin -She does not have to take Maxalt -She may continue Tylenol or Aleve for mild headache  -She will return in 6 months or sooner if needed, if she continues to do well, she may follow-up on an as-needed basis  2.  Right-sided retro-orbital piercing pain -No longer experiencing -MRI of the brain was normal  I spent 15 minutes with the patient. 50% of this time was spent discussing her plan of care.  Butler Denmark, AGNP-C, DNP 12/23/2018, 3:24 PM [facility] [address], [place], [place] [address] [phone]

## 2018-12-23 ENCOUNTER — Encounter: Payer: Self-pay | Admitting: Neurology

## 2018-12-23 ENCOUNTER — Other Ambulatory Visit: Payer: Self-pay

## 2018-12-23 ENCOUNTER — Ambulatory Visit (INDEPENDENT_AMBULATORY_CARE_PROVIDER_SITE_OTHER): Payer: BC Managed Care – PPO | Admitting: Neurology

## 2018-12-23 VITALS — BP 112/75 | HR 109 | Temp 97.4°F | Ht 63.0 in | Wt 254.6 lb

## 2018-12-23 DIAGNOSIS — R519 Headache, unspecified: Secondary | ICD-10-CM | POA: Diagnosis not present

## 2018-12-23 DIAGNOSIS — G8929 Other chronic pain: Secondary | ICD-10-CM

## 2018-12-24 NOTE — Progress Notes (Signed)
I have reviewed and agreed above plan. 

## 2019-04-07 ENCOUNTER — Other Ambulatory Visit: Payer: Self-pay | Admitting: Nurse Practitioner

## 2019-04-07 DIAGNOSIS — Z1231 Encounter for screening mammogram for malignant neoplasm of breast: Secondary | ICD-10-CM

## 2019-05-04 ENCOUNTER — Ambulatory Visit
Admission: RE | Admit: 2019-05-04 | Discharge: 2019-05-04 | Disposition: A | Discharge status: Home / Self Care | Payer: BC Managed Care – PPO | Source: Ambulatory Visit | Attending: Nurse Practitioner | Admitting: Nurse Practitioner

## 2019-05-04 ENCOUNTER — Other Ambulatory Visit: Payer: Self-pay

## 2019-05-04 DIAGNOSIS — Z1231 Encounter for screening mammogram for malignant neoplasm of breast: Secondary | ICD-10-CM

## 2019-06-24 ENCOUNTER — Ambulatory Visit: Payer: BC Managed Care – PPO | Admitting: Neurology

## 2019-06-24 NOTE — Progress Notes (Deleted)
PATIENT: Melanie Benson DOB: [birth year]  REASON FOR VISIT: follow up HISTORY FROM: patient  HISTORY OF PRESENT ILLNESS: Today 06/24/19  HISTORY HISTORY  04/22/2018 Dr. Thersa Salt Johnsonis a 49 years old female, seen in request byhis primary care PABecker, Annafor evaluation of pain on the right side of her head radiating to her neck, sensitivity in that area, initial evaluation was on April 22, 2018.  I have reviewed and summarized the referring note from the referring physician.She has past medical history of hypertension, hyperlipidemia diabetes, acid reflux. History of lumbar compression surgery due to L5 hemangioma with significant bony expansion, including posterior elements, with almost complete obliteration of the thecal sac at the level, and bilateral foraminal narrowing.  She reported a long history of migraine headaches, her typical migraine are lateralized severe pounding headache with associated light noise sensitivity, nauseous, lasting for couple hours, relieved by lying down in dark quiet room, Excedrin Migraine as needed, she only has her typical migraine headaches couple times each year.  She began to have different headaches since October 2019, is more frequent, couple times a week, shorter lasting, lasting 30 minutes, different kind of pain, it is feel like ice pricking into her right eyeball, slowly pushing, sharp pain, sometimes her right skull become sensitive to touch, there is no significant light noise sensitivity, resolved by taking NSAIDs as needed, trigger for her headaches are stress.  June 23, 2018 SS:She had MRI of the brain in February 2020 was normal.She is only able to take gabapentin 300 mg once daily, taking twice daily made her too drowsy. She has not had to take the Maxalt. She reports that since her last visit her headaches are much better, attributes this to getting more sleep, less stress, forced to slow down with the  coronavirus pandemic. She reports since her last visit she is only had about 2 headaches. She denies any changes to her medical history. She denies any new problems or concerns  December 23, 2018 SS: She continues to benefit from switching jobs.  She is working at [employer], dealing with students.  She continues to report less stress, sleeping more, able to spend more time with her daughter.  Her headaches are now rare.  She no longer has a feeling of tingling on the right side of her face.  She is no longer taking gabapentin.  She has not had to take Maxalt.  She may have an occasional mild headache that she will take Tylenol or Aleve.  Her blood sugars are doing much better, she will likely be able to discontinue metformin.  She presents today for follow-up unaccompanied.   Update June 24, 2019 SS:   REVIEW OF SYSTEMS: Out of a complete 14 system review of symptoms, the patient complains only of the following symptoms, and all other reviewed systems are negative.  ALLERGIES: Allergies  Allergen Reactions  . Lisinopril Cough  . Pollen Extract   . Simvastatin Other (See Comments)    "felt sick"    HOME MEDICATIONS: Outpatient Medications Prior to Visit  Medication Sig Dispense Refill  . aspirin EC 81 MG tablet Take 81 mg by mouth daily.    Marland Kitchen gabapentin (NEURONTIN) 300 MG capsule Take 1 capsule (300 mg total) by mouth 2 (two) times daily. 180 capsule 4  . IBUPROFEN PO Take 200 mg by mouth as needed.    . metFORMIN (GLUCOPHAGE-XR) 500 MG 24 hr tablet Take 500 mg by mouth daily.    Marland Kitchen  rizatriptan (MAXALT-MLT) 5 MG disintegrating tablet Take 1 tablet (5 mg total) by mouth as needed. May repeat in 2 hours if needed 15 tablet 6  . rosuvastatin (CRESTOR) 10 MG tablet Take 10 mg by mouth daily.    . valsartan-hydrochlorothiazide (DIOVAN-HCT) 160-12.5 MG tablet Take 1 tablet by mouth daily.     No facility-administered medications prior to visit.    PAST MEDICAL HISTORY: Past Medical History:    Diagnosis Date  . Allergic rhinitis   . Diabetes ([place])   . GERD (gastroesophageal reflux disease)   . Head pain   . Headache   . Hyperlipemia   . Hypertension   . Premature menopause     PAST SURGICAL HISTORY: Past Surgical History:  Procedure Laterality Date  . CESAREAN SECTION    . EMBOLIZATION    . LAMINECTOMY    . OPEN NEEDLE BIOPSY SPINE    . VERTEBROPLASTY      FAMILY HISTORY: Family History  Problem Relation Age of Onset  . Cancer - Lung Mother   . Diabetes Other   . Hypertension Other   . Hyperlipidemia Other   . Cancer - Ovarian Other   . Hyperlipidemia Maternal Grandmother   . Hypertension Maternal Grandmother   . Cervical cancer Maternal Grandmother   . Kidney failure Maternal Grandmother   . Hypertension Maternal Grandfather   . Diabetes Paternal Grandmother   . Hypertension Paternal Grandmother   . Breast cancer Paternal Grandmother 6  . Kidney failure Paternal Grandmother   . Heart failure Father   . Sarcoidosis Father     SOCIAL HISTORY: Social History   Socioeconomic History  . Marital status: Divorced    Spouse name: Not on file  . Number of children: 1  . Years of education: college  . Highest education level: Master's degree (e.g., MA, MS, MEng, MEd, MSW, MBA)  Occupational History  . Occupation: Probation officer  Tobacco Use  . Smoking status: Never Smoker  . Smokeless tobacco: Never Used  Substance and Sexual Activity  . Alcohol use: Yes    Comment: rare  . Drug use: No  . Sexual activity: Not on file  Other Topics Concern  . Not on file  Social History Narrative   Lives at home with daughter and niece.   Right-handed.   1 cup caffeine per day.   Social Determinants of Health   Financial Resource Strain:   . Difficulty of Paying Living Expenses:   Food Insecurity:   . Worried About Charity fundraiser in the Last Year:   . Arboriculturist in the Last Year:   Transportation Needs:   . Film/video editor  (Medical):   Marland Kitchen Lack of Transportation (Non-Medical):   Physical Activity:   . Days of Exercise per Week:   . Minutes of Exercise per Session:   Stress:   . Feeling of Stress :   Social Connections:   . Frequency of Communication with Friends and Family:   . Frequency of Social Gatherings with Friends and Family:   . Attends Religious Services:   . Active Member of Clubs or Organizations:   . Attends Archivist Meetings:   Marland Kitchen Marital Status:   Intimate Partner Violence:   . Fear of Current or Ex-Partner:   . Emotionally Abused:   Marland Kitchen Physically Abused:   . Sexually Abused:       PHYSICAL EXAM  There were no vitals filed for this visit. There is no  height or weight on file to calculate BMI.  Generalized: Well developed, in no acute distress   Neurological examination  Mentation: Alert oriented to time, place, history taking. Follows all commands speech and language fluent Cranial nerve II-XII: Pupils were equal round reactive to light. Extraocular movements were full, visual field were full on confrontational test. Facial sensation and strength were normal. Uvula tongue midline. Head turning and shoulder shrug  were normal and symmetric. Motor: The motor testing reveals 5 over 5 strength of all 4 extremities. Good symmetric motor tone is noted throughout.  Sensory: Sensory testing is intact to soft touch on all 4 extremities. No evidence of extinction is noted.  Coordination: Cerebellar testing reveals good finger-nose-finger and heel-to-shin bilaterally.  Gait and station: Gait is normal. Tandem gait is normal. Romberg is negative. No drift is seen.  Reflexes: Deep tendon reflexes are symmetric and normal bilaterally.   DIAGNOSTIC DATA (LABS, IMAGING, TESTING) - I reviewed patient records, labs, notes, testing and imaging myself where available.  Lab Results  Component Value Date   WBC 9.2 10/26/2014   HGB 13.2 10/26/2014   HCT 40.2 10/26/2014   MCV 85.7  10/26/2014   PLT 370 10/26/2014      Component Value Date/Time   NA 140 01/14/2010 1220   K 4.0 01/14/2010 1220   CL 105 01/14/2010 1220   CO2 29 01/14/2010 1220   GLUCOSE 108 (H) 01/14/2010 1220   BUN 12 01/14/2010 1220   CREATININE 0.85 01/14/2010 1220   CALCIUM 9.1 01/14/2010 1220   PROT 6.7 07/07/2015 1227   ALBUMIN 4.2 07/07/2015 1227   AST 14 07/07/2015 1227   ALT 16 07/07/2015 1227   ALKPHOS 62 07/07/2015 1227   BILITOT 0.3 07/07/2015 1227   GFRNONAA >60 01/14/2010 1220   GFRAA  01/14/2010 1220    >60        The eGFR has been calculated using the MDRD equation. This calculation has not been validated in all clinical situations. eGFR's persistently <60 mL/min signify possible Chronic Kidney Disease.   No results found for: CHOL, HDL, LDLCALC, LDLDIRECT, TRIG, CHOLHDL No results found for: HGBA1C No results found for: VITAMINB12 No results found for: TSH    ASSESSMENT AND PLAN 49 y.o. year old female  has a past medical history of Allergic rhinitis, Diabetes ([place]), GERD (gastroesophageal reflux disease), Head pain, Headache, Hyperlipemia, Hypertension, and Premature menopause. here with ***   I spent 15 minutes with the patient. 50% of this time was spent   Butler Denmark, [place], DNP 06/24/2019, 5:54 AM [facility] [address] [place], [place] [address] [phone]

## 2019-07-13 ENCOUNTER — Telehealth: Payer: BC Managed Care – PPO | Admitting: Neurology

## 2019-07-13 NOTE — Progress Notes (Deleted)
PATIENT: Melanie Benson DOB: [birth year]  REASON FOR VISIT: follow up HISTORY FROM: patient  HISTORY OF PRESENT ILLNESS: Today 07/13/19  HISTORY HISTORY  04/22/2018 Dr. Thersa Salt Johnsonis a 49 years old female, seen in request byhis primary care PABecker, Annafor evaluation of pain on the right side of her head radiating to her neck, sensitivity in that area, initial evaluation was on April 22, 2018.  I have reviewed and summarized the referring note from the referring physician.She has past medical history of hypertension, hyperlipidemia diabetes, acid reflux. History of lumbar compression surgery due to L5 hemangioma with significant bony expansion, including posterior elements, with almost complete obliteration of the thecal sac at the level, and bilateral foraminal narrowing.  She reported a long history of migraine headaches, her typical migraine are lateralized severe pounding headache with associated light noise sensitivity, nauseous, lasting for couple hours, relieved by lying down in dark quiet room, Excedrin Migraine as needed, she only has her typical migraine headaches couple times each year.  She began to have different headaches since October 2019, is more frequent, couple times a week, shorter lasting, lasting 30 minutes, different kind of pain, it is feel like ice pricking into her right eyeball, slowly pushing, sharp pain, sometimes her right skull become sensitive to touch, there is no significant light noise sensitivity, resolved by taking NSAIDs as needed, trigger for her headaches are stress.  June 23, 2018 SS:She had MRI of the brain in February 2020 was normal.She is only able to take gabapentin 300 mg once daily, taking twice daily made her too drowsy. She has not had to take the Maxalt. She reports that since her last visit her headaches are much better, attributes this to getting more sleep, less stress, forced to slow down with the  coronavirus pandemic. She reports since her last visit she is only had about 2 headaches. She denies any changes to her medical history. She denies any new problems or concerns  December 23, 2018 SS: She continues to benefit from switching jobs.  She is working at [employer], dealing with students.  She continues to report less stress, sleeping more, able to spend more time with her daughter.  Her headaches are now rare.  She no longer has a feeling of tingling on the right side of her face.  She is no longer taking gabapentin.  She has not had to take Maxalt.  She may have an occasional mild headache that she will take Tylenol or Aleve.  Her blood sugars are doing much better, she will likely be able to discontinue metformin.  She presents today for follow-up unaccompanied.   Update Jul 13, 2019 SS:   REVIEW OF SYSTEMS: Out of a complete 14 system review of symptoms, the patient complains only of the following symptoms, and all other reviewed systems are negative.  ALLERGIES: Allergies  Allergen Reactions  . Lisinopril Cough  . Pollen Extract   . Simvastatin Other (See Comments)    "felt sick"    HOME MEDICATIONS: Outpatient Medications Prior to Visit  Medication Sig Dispense Refill  . aspirin EC 81 MG tablet Take 81 mg by mouth daily.    Marland Kitchen gabapentin (NEURONTIN) 300 MG capsule Take 1 capsule (300 mg total) by mouth 2 (two) times daily. 180 capsule 4  . IBUPROFEN PO Take 200 mg by mouth as needed.    . metFORMIN (GLUCOPHAGE-XR) 500 MG 24 hr tablet Take 500 mg by mouth daily.    Marland Kitchen  rizatriptan (MAXALT-MLT) 5 MG disintegrating tablet Take 1 tablet (5 mg total) by mouth as needed. May repeat in 2 hours if needed 15 tablet 6  . rosuvastatin (CRESTOR) 10 MG tablet Take 10 mg by mouth daily.    . valsartan-hydrochlorothiazide (DIOVAN-HCT) 160-12.5 MG tablet Take 1 tablet by mouth daily.     No facility-administered medications prior to visit.    PAST MEDICAL HISTORY: Past Medical History:    Diagnosis Date  . Allergic rhinitis   . Diabetes ([place])   . GERD (gastroesophageal reflux disease)   . Head pain   . Headache   . Hyperlipemia   . Hypertension   . Premature menopause     PAST SURGICAL HISTORY: Past Surgical History:  Procedure Laterality Date  . CESAREAN SECTION    . EMBOLIZATION    . LAMINECTOMY    . OPEN NEEDLE BIOPSY SPINE    . VERTEBROPLASTY      FAMILY HISTORY: Family History  Problem Relation Age of Onset  . Cancer - Lung Mother   . Diabetes Other   . Hypertension Other   . Hyperlipidemia Other   . Cancer - Ovarian Other   . Hyperlipidemia Maternal Grandmother   . Hypertension Maternal Grandmother   . Cervical cancer Maternal Grandmother   . Kidney failure Maternal Grandmother   . Hypertension Maternal Grandfather   . Diabetes Paternal Grandmother   . Hypertension Paternal Grandmother   . Breast cancer Paternal Grandmother 34  . Kidney failure Paternal Grandmother   . Heart failure Father   . Sarcoidosis Father     SOCIAL HISTORY: Social History   Socioeconomic History  . Marital status: Divorced    Spouse name: Not on file  . Number of children: 1  . Years of education: college  . Highest education level: Master's degree (e.g., MA, MS, MEng, MEd, MSW, MBA)  Occupational History  . Occupation: Probation officer  Tobacco Use  . Smoking status: Never Smoker  . Smokeless tobacco: Never Used  Substance and Sexual Activity  . Alcohol use: Yes    Comment: rare  . Drug use: No  . Sexual activity: Not on file  Other Topics Concern  . Not on file  Social History Narrative   Lives at home with daughter and niece.   Right-handed.   1 cup caffeine per day.   Social Determinants of Health   Financial Resource Strain:   . Difficulty of Paying Living Expenses:   Food Insecurity:   . Worried About Charity fundraiser in the Last Year:   . Arboriculturist in the Last Year:   Transportation Needs:   . Film/video editor  (Medical):   Marland Kitchen Lack of Transportation (Non-Medical):   Physical Activity:   . Days of Exercise per Week:   . Minutes of Exercise per Session:   Stress:   . Feeling of Stress :   Social Connections:   . Frequency of Communication with Friends and Family:   . Frequency of Social Gatherings with Friends and Family:   . Attends Religious Services:   . Active Member of Clubs or Organizations:   . Attends Archivist Meetings:   Marland Kitchen Marital Status:   Intimate Partner Violence:   . Fear of Current or Ex-Partner:   . Emotionally Abused:   Marland Kitchen Physically Abused:   . Sexually Abused:       PHYSICAL EXAM  There were no vitals filed for this visit. There is no  height or weight on file to calculate BMI.  Generalized: Well developed, in no acute distress   Neurological examination  Mentation: Alert oriented to time, place, history taking. Follows all commands speech and language fluent Cranial nerve II-XII: Pupils were equal round reactive to light. Extraocular movements were full, visual field were full on confrontational test. Facial sensation and strength were normal. Uvula tongue midline. Head turning and shoulder shrug  were normal and symmetric. Motor: The motor testing reveals 5 over 5 strength of all 4 extremities. Good symmetric motor tone is noted throughout.  Sensory: Sensory testing is intact to soft touch on all 4 extremities. No evidence of extinction is noted.  Coordination: Cerebellar testing reveals good finger-nose-finger and heel-to-shin bilaterally.  Gait and station: Gait is normal. Tandem gait is normal. Romberg is negative. No drift is seen.  Reflexes: Deep tendon reflexes are symmetric and normal bilaterally.   DIAGNOSTIC DATA (LABS, IMAGING, TESTING) - I reviewed patient records, labs, notes, testing and imaging myself where available.  Lab Results  Component Value Date   WBC 9.2 10/26/2014   HGB 13.2 10/26/2014   HCT 40.2 10/26/2014   MCV 85.7  10/26/2014   PLT 370 10/26/2014      Component Value Date/Time   NA 140 01/14/2010 1220   K 4.0 01/14/2010 1220   CL 105 01/14/2010 1220   CO2 29 01/14/2010 1220   GLUCOSE 108 (H) 01/14/2010 1220   BUN 12 01/14/2010 1220   CREATININE 0.85 01/14/2010 1220   CALCIUM 9.1 01/14/2010 1220   PROT 6.7 07/07/2015 1227   ALBUMIN 4.2 07/07/2015 1227   AST 14 07/07/2015 1227   ALT 16 07/07/2015 1227   ALKPHOS 62 07/07/2015 1227   BILITOT 0.3 07/07/2015 1227   GFRNONAA >60 01/14/2010 1220   GFRAA  01/14/2010 1220    >60        The eGFR has been calculated using the MDRD equation. This calculation has not been validated in all clinical situations. eGFR's persistently <60 mL/min signify possible Chronic Kidney Disease.   No results found for: CHOL, HDL, LDLCALC, LDLDIRECT, TRIG, CHOLHDL No results found for: HGBA1C No results found for: VITAMINB12 No results found for: TSH    ASSESSMENT AND PLAN 49 y.o. year old female  has a past medical history of Allergic rhinitis, Diabetes ([place]), GERD (gastroesophageal reflux disease), Head pain, Headache, Hyperlipemia, Hypertension, and Premature menopause. here with:  1.  Chronic migraine headache 2.  Right-sided retro-orbital piercing pain   I spent 15 minutes with the patient. 50% of this time was spent   Butler Denmark, [place], DNP 07/13/2019, 5:51 AM [facility] [address], [place], [place] [address] [phone]

## 2020-02-23 IMAGING — MG DIGITAL SCREENING BILATERAL MAMMOGRAM WITH TOMO AND CAD
8 series · 8 of 24 positions shown · non-contrast
Comparison: Previous exam(s).

ACR Breast Density Category a: The breast tissue is almost entirely
fatty.

CLINICAL DATA: Screening.

EXAM:
DIGITAL SCREENING BILATERAL MAMMOGRAM WITH TOMO AND CAD

[L MLO synth-2D]
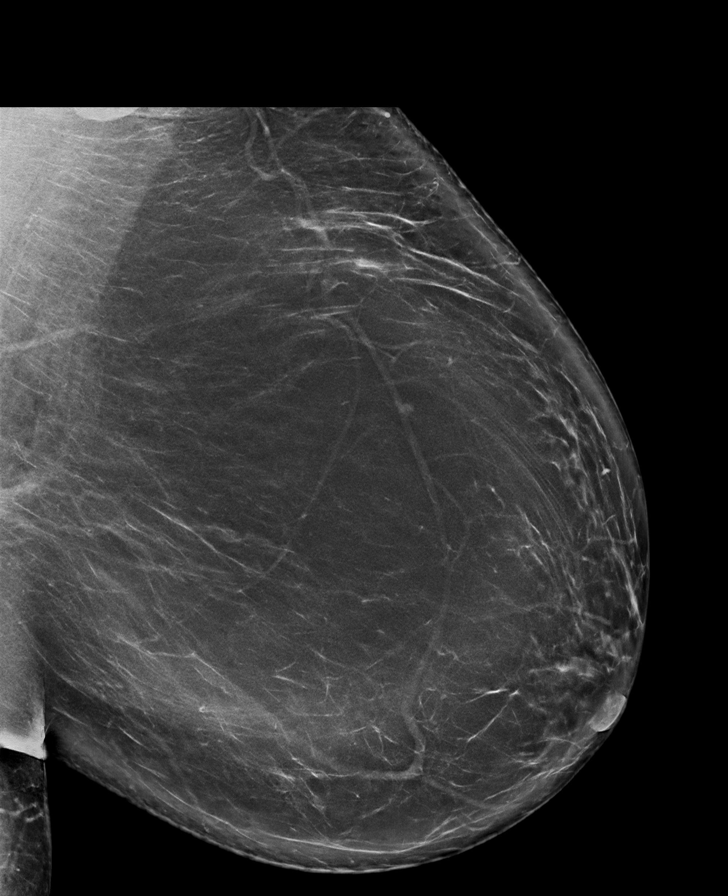

[R CC synth-2D]
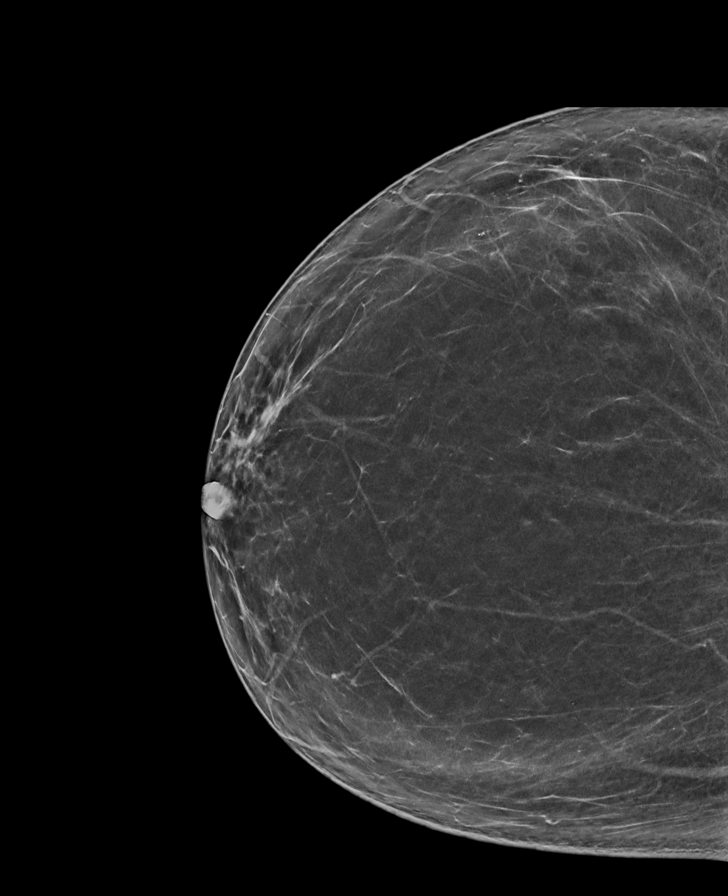

[L CC synth-2D]
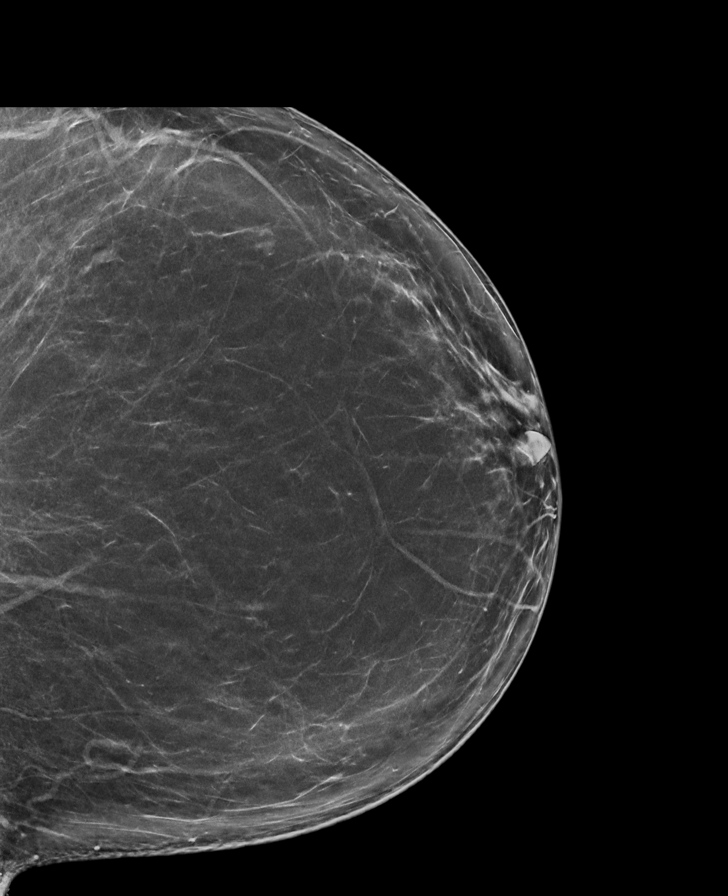

[R MLO synth-2D]
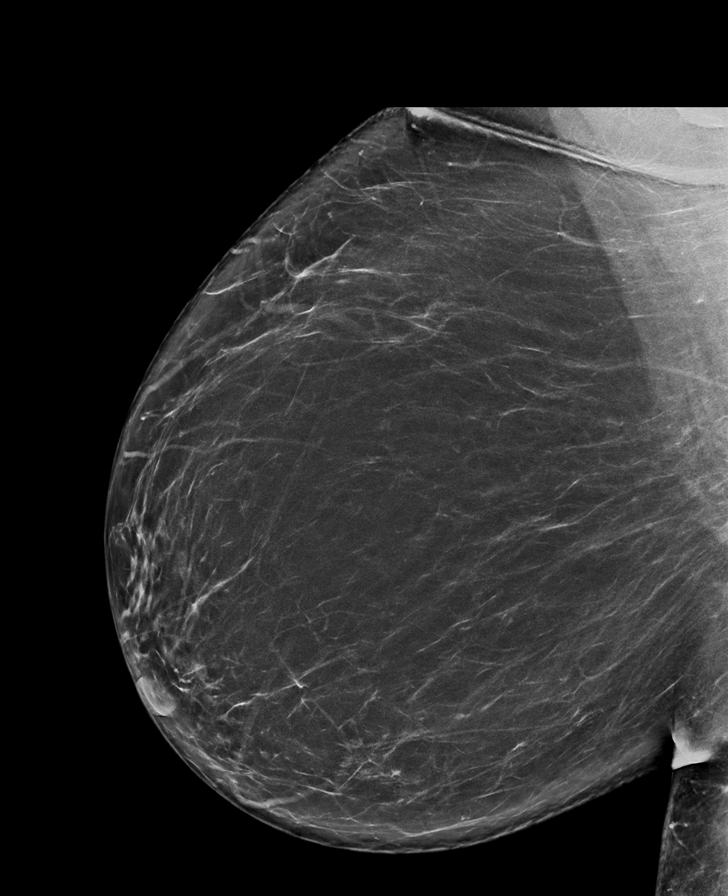

[L MLO tomo · tomo slice 53/106.0]
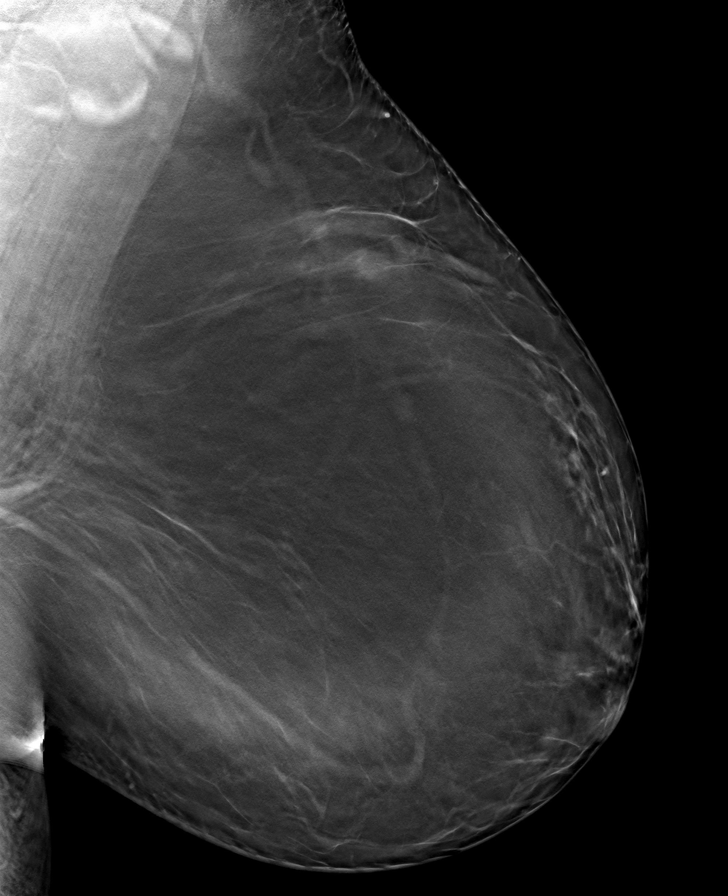

[R MLO tomo · tomo slice 49/98.0]
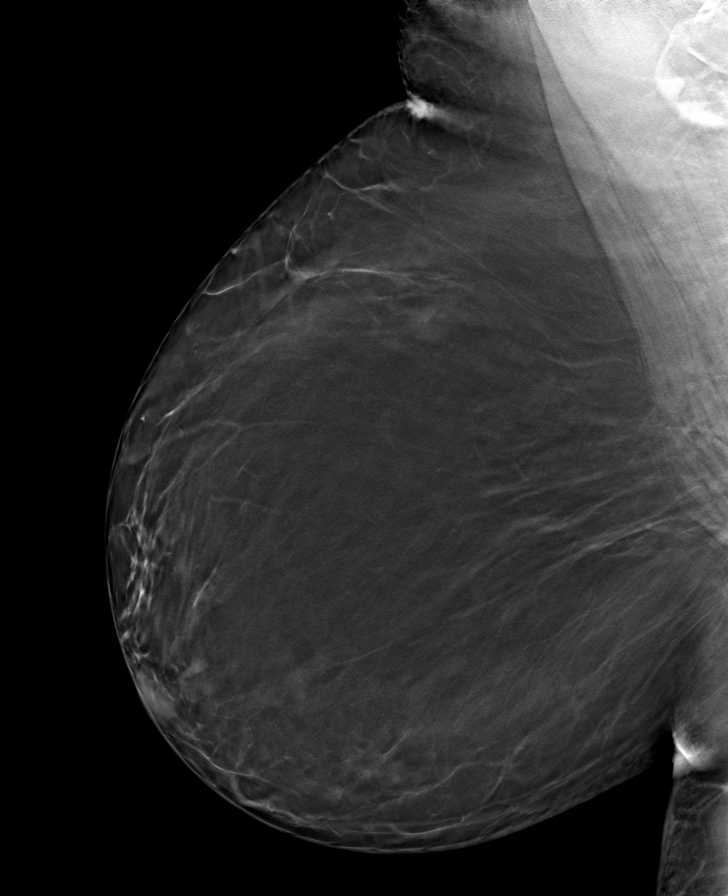

[L CC tomo · tomo slice 46/91.0]
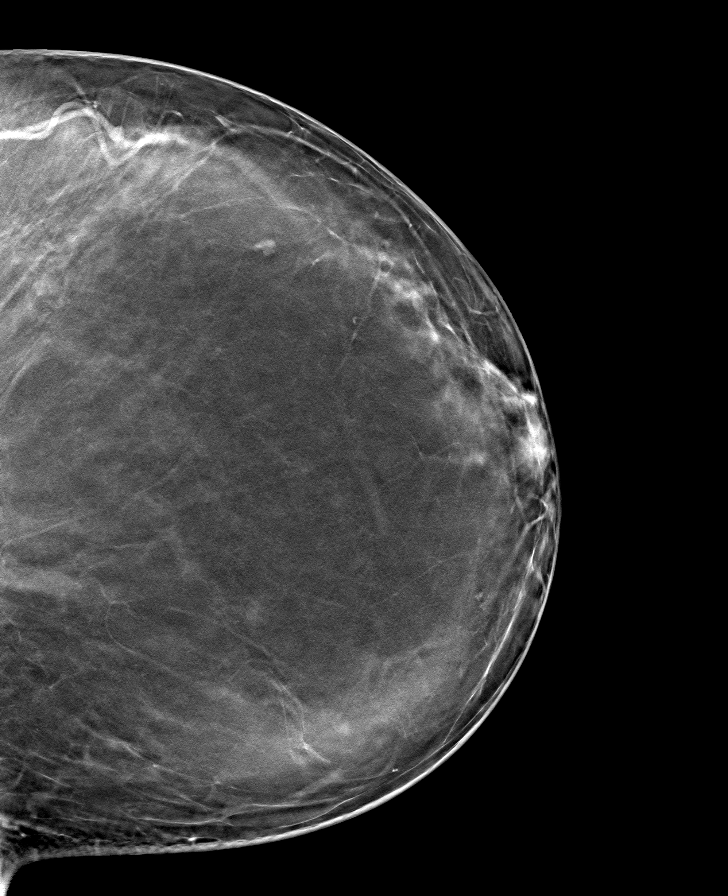

[R CC tomo · tomo slice 45/90.0]
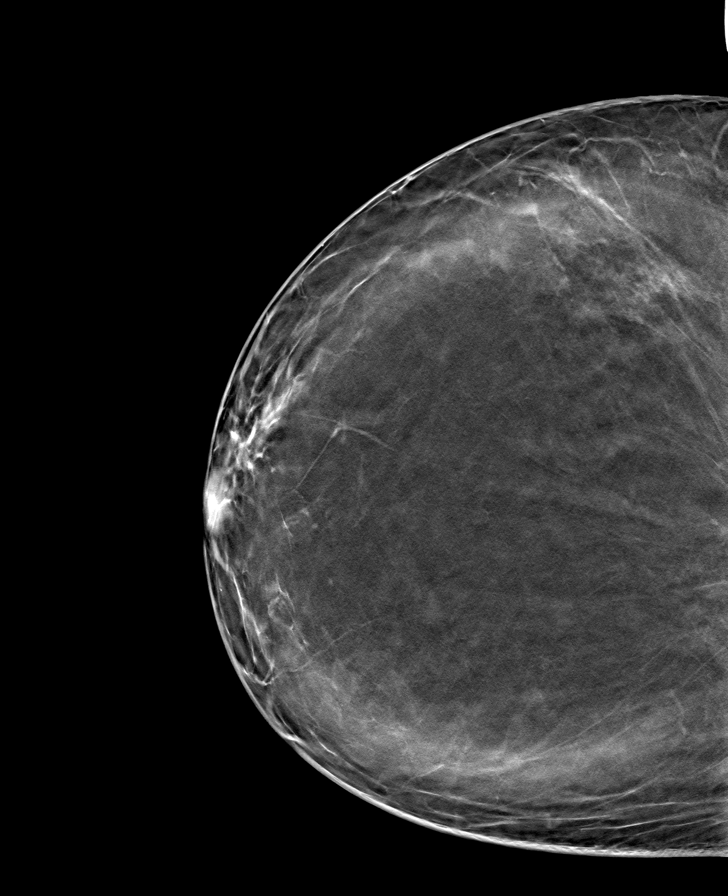

[8 of 24 positions shown; findings below may reference images not displayed]

FINDINGS: There are no findings suspicious for malignancy. Images were
processed with CAD.
IMPRESSION: No mammographic evidence of malignancy. A result letter of this
screening mammogram will be mailed directly to the patient.

RECOMMENDATION:
Screening mammogram in one year. (Code:[id number])

BI-RADS CATEGORY  1: Negative.

## 2020-05-03 ENCOUNTER — Other Ambulatory Visit: Payer: Self-pay | Admitting: Obstetrics and Gynecology

## 2020-05-03 DIAGNOSIS — Z1231 Encounter for screening mammogram for malignant neoplasm of breast: Secondary | ICD-10-CM

## 2020-06-24 ENCOUNTER — Inpatient Hospital Stay: Admission: RE | Admit: 2020-06-24 | Payer: BC Managed Care – PPO | Source: Ambulatory Visit

## 2020-06-27 ENCOUNTER — Other Ambulatory Visit: Payer: Self-pay | Admitting: Family Medicine

## 2020-06-27 DIAGNOSIS — Z1231 Encounter for screening mammogram for malignant neoplasm of breast: Secondary | ICD-10-CM

## 2020-08-12 DIAGNOSIS — Z1231 Encounter for screening mammogram for malignant neoplasm of breast: Secondary | ICD-10-CM

## 2020-08-15 ENCOUNTER — Other Ambulatory Visit: Payer: Self-pay | Admitting: Family Medicine

## 2020-08-15 DIAGNOSIS — Z1231 Encounter for screening mammogram for malignant neoplasm of breast: Secondary | ICD-10-CM

## 2020-08-20 DIAGNOSIS — Z1231 Encounter for screening mammogram for malignant neoplasm of breast: Secondary | ICD-10-CM

## 2020-09-06 ENCOUNTER — Other Ambulatory Visit: Payer: Self-pay | Admitting: Family Medicine

## 2020-09-06 DIAGNOSIS — Z1231 Encounter for screening mammogram for malignant neoplasm of breast: Secondary | ICD-10-CM

## 2020-10-28 ENCOUNTER — Ambulatory Visit
Admission: RE | Admit: 2020-10-28 | Discharge: 2020-10-28 | Disposition: A | Discharge status: Home / Self Care | Payer: BC Managed Care – PPO | Source: Ambulatory Visit

## 2020-10-28 ENCOUNTER — Other Ambulatory Visit: Payer: Self-pay

## 2020-10-28 DIAGNOSIS — Z1231 Encounter for screening mammogram for malignant neoplasm of breast: Secondary | ICD-10-CM

## 2021-04-07 ENCOUNTER — Other Ambulatory Visit (HOSPITAL_COMMUNITY)
Admission: RE | Admit: 2021-04-07 | Discharge: 2021-04-07 | Disposition: A | Discharge status: Home / Self Care | Payer: BC Managed Care – PPO | Source: Ambulatory Visit | Attending: Obstetrics and Gynecology | Admitting: Obstetrics and Gynecology

## 2021-04-07 ENCOUNTER — Other Ambulatory Visit: Payer: Self-pay | Admitting: Obstetrics and Gynecology

## 2021-04-07 DIAGNOSIS — Z01419 Encounter for gynecological examination (general) (routine) without abnormal findings: Secondary | ICD-10-CM | POA: Diagnosis present

## 2021-04-10 LAB — CYTOLOGY - PAP
Comment: NEGATIVE
Diagnosis: NEGATIVE
High risk HPV: NEGATIVE

## 2021-07-24 ENCOUNTER — Ambulatory Visit
Admission: RE | Admit: 2021-07-24 | Discharge: 2021-07-24 | Disposition: A | Discharge status: Home / Self Care | Payer: BC Managed Care – PPO | Source: Ambulatory Visit | Attending: Family Medicine | Admitting: Family Medicine

## 2021-07-24 ENCOUNTER — Other Ambulatory Visit: Payer: Self-pay | Admitting: Family Medicine

## 2021-07-24 DIAGNOSIS — R053 Chronic cough: Secondary | ICD-10-CM

## 2021-07-28 ENCOUNTER — Other Ambulatory Visit: Payer: Self-pay | Admitting: Family Medicine

## 2021-07-28 DIAGNOSIS — R9389 Abnormal findings on diagnostic imaging of other specified body structures: Secondary | ICD-10-CM

## 2021-08-22 ENCOUNTER — Ambulatory Visit
Admission: RE | Admit: 2021-08-22 | Discharge: 2021-08-22 | Disposition: A | Discharge status: Home / Self Care | Payer: BC Managed Care – PPO | Source: Ambulatory Visit | Attending: Family Medicine | Admitting: Family Medicine

## 2021-08-22 DIAGNOSIS — R9389 Abnormal findings on diagnostic imaging of other specified body structures: Secondary | ICD-10-CM

## 2021-10-10 ENCOUNTER — Institutional Professional Consult (permissible substitution): Payer: BC Managed Care – PPO | Admitting: Pulmonary Disease

## 2021-10-16 ENCOUNTER — Encounter: Payer: Self-pay | Admitting: Pulmonary Disease

## 2021-10-16 ENCOUNTER — Ambulatory Visit: Payer: BC Managed Care – PPO | Admitting: Pulmonary Disease

## 2021-10-16 VITALS — BP 124/62 | HR 92 | Temp 98.6°F | Ht 64.0 in | Wt 248.0 lb

## 2021-10-16 DIAGNOSIS — J986 Disorders of diaphragm: Secondary | ICD-10-CM | POA: Diagnosis not present

## 2021-10-16 NOTE — Patient Instructions (Signed)
Nice to meet you  I think the diaphragm on the right side is not working.  This could be a result of COVID infection in November 2022.  I recommend repeat a chest x-ray in November of this year and follow-up afterwards to discuss results and see how your breathing is doing.  We can consider medicines, inhalers in the future but I do not think that is necessary right now.  Return to clinic in 3 months with chest x-ray prior to visit, follow-up with Dr. Judeth Horn

## 2021-10-16 NOTE — Progress Notes (Signed)
_0  ID: Burnett Corrente, female    DOB: [birth year], 51 y.o.   MRN: [id number]  Chief Complaint  Patient presents with   Consult    Consult for abnormal CT results. Pt states CT was done on 08/23/2021. Pt states that she does have a cough and persistent. Pt states she does have issues getting a deep breath. Pt states that this has been occurring since she had covid in [place]. No inhalers noted from patient.     Referring provider: Lois Huxley, PA  HPI:   51 y.o. woman who was seen in consultation for evaluation of elevated right hemidiaphragm.  Note from referring provider reviewed.  Patient was in usual health.  She contracted COVID for the second 01/2021.  Has had a significant.  Overall greatly improved.  In addition, noticed some dyspnea when seen 02/2021.  A member of the congregation at church noted she seemed short of breath.  Which she endorsed.  This was new to her prior to COVID infection.  Left leg several months this has been evaluated.  She denies any dyspnea when exerting herself.  Able to exercise.  Actively trying to exercise.  Endorses attempting a healthier lifestyle.  No issues with this.  Still with issues singing.  Notably, singing in church was on a hiatus for a couple of years from the start of the pandemic.  Chest x-ray obtained 07/2021.  On my read interpretation reveals clear lungs bilaterally elevation of the right diaphragm.  This prompted a CT scan of her chest 08/2018.  On my review interpretation reveals clear lungs bilaterally with elevation of the right hemidiaphragm.  She denies any history of thoracic or heart surgery.  No cervical spine surgery.  No cervical spine pathology.  Some shoulder impingement but nothing in the spine.  No trauma to the area.  PMH: Diabetes, hypertension Surgical history: C-section, lumbar laminectomy, Family history: Mother with lung cancer, probable sarcoidosis, heart failure Social history: Never smoker, lives in  [place], works at [employer] / Pulmonary Flowsheets:   ACT:      No data to display          MMRC:     No data to display          Epworth:      No data to display          Tests:   FENO:  No results found for: "NITRICOXIDE"  PFT:     No data to display          WALK:      No data to display          Imaging: Personally reviewed and as per EMR discussion this note No results found.  Lab Results: Personally reviewed CBC    Component Value Date/Time   WBC 9.2 10/26/2014 1509   RBC 4.69 10/26/2014 1509   HGB 13.2 10/26/2014 1509   HCT 40.2 10/26/2014 1509   PLT 370 10/26/2014 1509   MCV 85.7 10/26/2014 1509   MCH 28.1 10/26/2014 1509   MCHC 32.8 10/26/2014 1509   RDW 14.4 10/26/2014 1509   LYMPHSABS 3.2 10/26/2014 1509   MONOABS 0.5 10/26/2014 1509   EOSABS 0.2 10/26/2014 1509   BASOSABS 0.0 10/26/2014 1509    BMET    Component Value Date/Time   NA 140 01/14/2010 1220   K 4.0 01/14/2010 1220   CL 105 01/14/2010 1220   CO2 29 01/14/2010 1220   GLUCOSE 108 (  H) 01/14/2010 1220   BUN 12 01/14/2010 1220   CREATININE 0.85 01/14/2010 1220   CALCIUM 9.1 01/14/2010 1220   GFRNONAA >60 01/14/2010 1220   GFRAA  01/14/2010 1220    >60        The eGFR has been calculated using the MDRD equation. This calculation has not been validated in all clinical situations. eGFR's persistently <60 mL/min signify possible Chronic Kidney Disease.    BNP No results found for: "BNP"  ProBNP No results found for: "PROBNP"  Specialty Problems       Pulmonary Problems   Allergic rhinitis    Allergies  Allergen Reactions   Lisinopril Cough   Pollen Extract    Simvastatin Other (See Comments)    "felt sick"    Immunization History  Administered Date(s) Administered   Tdap 04/02/2016    Past Medical History:  Diagnosis Date   Allergic rhinitis    Diabetes (HCC)    GERD (gastroesophageal reflux disease)    Head  pain    Headache    Hyperlipemia    Hypertension    Premature menopause     Tobacco History: Social History   Tobacco Use  Smoking Status Never  Smokeless Tobacco Never   Counseling given: Not Answered   Continue to not smoke  Outpatient Encounter Medications as of 10/16/2021  Medication Sig   aspirin EC 81 MG tablet Take 81 mg by mouth daily.   IBUPROFEN PO Take 200 mg by mouth as needed.   rosuvastatin (CRESTOR) 10 MG tablet Take 10 mg by mouth daily.   valsartan-hydrochlorothiazide (DIOVAN-HCT) 160-12.5 MG tablet Take 1 tablet by mouth daily.   [DISCONTINUED] gabapentin (NEURONTIN) 300 MG capsule Take 1 capsule (300 mg total) by mouth 2 (two) times daily.   [DISCONTINUED] metFORMIN (GLUCOPHAGE-XR) 500 MG 24 hr tablet Take 500 mg by mouth daily.   [DISCONTINUED] rizatriptan (MAXALT-MLT) 5 MG disintegrating tablet Take 1 tablet (5 mg total) by mouth as needed. May repeat in 2 hours if needed   No facility-administered encounter medications on file as of 10/16/2021.     Review of Systems  Review of Systems  No chest pain with exertion.  No orthopnea or PND.  Comprehensive review of systems otherwise negative. Physical Exam  BP 124/62 (BP Location: Right Arm, Patient Position: Sitting, Cuff Size: Normal)   Pulse 92   Temp 98.6 F (37 C) (Oral)   Ht _0  (1.626 m)   Wt 248 lb (112.5 kg)   SpO2 97%   BMI 42.57 kg/m   Wt Readings from Last 5 Encounters:  10/16/21 248 lb (112.5 kg)  12/23/18 254 lb 9.6 oz (115.5 kg)  04/22/18 252 lb (114.3 kg)    BMI Readings from Last 5 Encounters:  10/16/21 42.57 kg/m  12/23/18 45.10 kg/m  04/22/18 43.60 kg/m     Physical Exam General: Well-appearing, no acute distress Eyes: EOMI, no icterus Neck: Supple, no JVP Pulmonary: Clear, normal work of breathing, diminished in the right base Cardiovascular: Warm, no edema Abdomen: Nondistended, bowel sounds present MSK: No synovitis, no joint effusion Neuro: Normal gait, no  weakness Psych: Normal mood, full affect  Assessment & Plan:   Elevated right hemidiaphragm: Present on chest x-ray 07/2021.  Also demonstrated CT chest 08/2021.  Prior chest imaging in 2012 did not show this finding.  Unclear etiology.  No chest surgery, no cervical spine pathology.  No cervical spine surgery.  No trauma.  Possible diabetes related neuropathy versus viral mediated versus idiopathic.  Plan to repeat chest x-ray 01/2022 1 year from COVID infection which seems to be the most plausible of all explanations.  If still elevated, can discuss sniff test etc.  Mild dyspnea on exertion: With singing.  Not as much exertional.  Worsens COVID infection.  Query whether this is related to elevated right hemidiaphragm alone.  Or if COVID infection triggered reactive airway disease, asthma.  Continue to monitor.  Could consider ICS/LABA, bronchodilators in the future if still an issue or worsening.   Return in about 3 months (around 01/16/2022).   Lanier Clam, MD 10/16/2021

## 2021-10-17 ENCOUNTER — Other Ambulatory Visit: Payer: Self-pay | Admitting: Family Medicine

## 2021-10-17 DIAGNOSIS — Z1231 Encounter for screening mammogram for malignant neoplasm of breast: Secondary | ICD-10-CM

## 2021-10-26 DIAGNOSIS — Z1231 Encounter for screening mammogram for malignant neoplasm of breast: Secondary | ICD-10-CM

## 2021-10-31 ENCOUNTER — Ambulatory Visit
Admission: RE | Admit: 2021-10-31 | Discharge: 2021-10-31 | Disposition: A | Discharge status: Home / Self Care | Payer: BC Managed Care – PPO | Source: Ambulatory Visit

## 2021-10-31 DIAGNOSIS — Z1231 Encounter for screening mammogram for malignant neoplasm of breast: Secondary | ICD-10-CM

## 2021-11-02 ENCOUNTER — Other Ambulatory Visit: Payer: Self-pay | Admitting: Family Medicine

## 2021-11-02 DIAGNOSIS — R928 Other abnormal and inconclusive findings on diagnostic imaging of breast: Secondary | ICD-10-CM

## 2021-11-10 ENCOUNTER — Ambulatory Visit
Admission: RE | Admit: 2021-11-10 | Discharge: 2021-11-10 | Disposition: A | Discharge status: Home / Self Care | Payer: BC Managed Care – PPO | Source: Ambulatory Visit | Attending: Family Medicine | Admitting: Family Medicine

## 2021-11-10 ENCOUNTER — Other Ambulatory Visit: Payer: Self-pay | Admitting: Family Medicine

## 2021-11-10 DIAGNOSIS — R928 Other abnormal and inconclusive findings on diagnostic imaging of breast: Secondary | ICD-10-CM

## 2021-11-10 DIAGNOSIS — N6310 Unspecified lump in the right breast, unspecified quadrant: Secondary | ICD-10-CM

## 2021-11-16 ENCOUNTER — Ambulatory Visit
Admission: RE | Admit: 2021-11-16 | Discharge: 2021-11-16 | Disposition: A | Discharge status: Home / Self Care | Payer: BC Managed Care – PPO | Source: Ambulatory Visit | Attending: Family Medicine | Admitting: Family Medicine

## 2021-11-16 ENCOUNTER — Other Ambulatory Visit: Payer: Self-pay | Admitting: Family Medicine

## 2021-11-16 DIAGNOSIS — N6310 Unspecified lump in the right breast, unspecified quadrant: Secondary | ICD-10-CM

## 2022-04-13 ENCOUNTER — Other Ambulatory Visit: Payer: Self-pay | Admitting: Family Medicine

## 2022-04-13 DIAGNOSIS — N6310 Unspecified lump in the right breast, unspecified quadrant: Secondary | ICD-10-CM

## 2022-05-24 ENCOUNTER — Ambulatory Visit
Admission: RE | Admit: 2022-05-24 | Discharge: 2022-05-24 | Disposition: A | Discharge status: Home / Self Care | Payer: BC Managed Care – PPO | Source: Ambulatory Visit | Attending: Family Medicine | Admitting: Family Medicine

## 2022-05-24 ENCOUNTER — Other Ambulatory Visit: Payer: Self-pay | Admitting: Family Medicine

## 2022-05-24 DIAGNOSIS — N6310 Unspecified lump in the right breast, unspecified quadrant: Secondary | ICD-10-CM

## 2022-10-05 ENCOUNTER — Encounter: Payer: Self-pay | Admitting: Family Medicine

## 2022-11-06 ENCOUNTER — Ambulatory Visit
Admission: RE | Admit: 2022-11-06 | Discharge: 2022-11-06 | Disposition: A | Discharge status: Home / Self Care | Payer: BC Managed Care – PPO | Source: Ambulatory Visit | Attending: Family Medicine | Admitting: Family Medicine

## 2022-11-06 DIAGNOSIS — N6310 Unspecified lump in the right breast, unspecified quadrant: Secondary | ICD-10-CM

## 2023-10-07 ENCOUNTER — Other Ambulatory Visit: Payer: Self-pay | Admitting: Family Medicine

## 2023-10-07 DIAGNOSIS — D369 Benign neoplasm, unspecified site: Secondary | ICD-10-CM

## 2023-11-07 ENCOUNTER — Ambulatory Visit
Admission: RE | Admit: 2023-11-07 | Discharge: 2023-11-07 | Disposition: A | Discharge status: Home / Self Care | Payer: Self-pay | Source: Ambulatory Visit | Attending: Family Medicine | Admitting: Family Medicine

## 2023-11-07 DIAGNOSIS — D369 Benign neoplasm, unspecified site: Secondary | ICD-10-CM

## 2024-01-31 ENCOUNTER — Other Ambulatory Visit (HOSPITAL_BASED_OUTPATIENT_CLINIC_OR_DEPARTMENT_OTHER): Payer: Self-pay | Admitting: Family Medicine

## 2024-01-31 DIAGNOSIS — E785 Hyperlipidemia, unspecified: Secondary | ICD-10-CM

## 2024-02-18 ENCOUNTER — Other Ambulatory Visit (HOSPITAL_BASED_OUTPATIENT_CLINIC_OR_DEPARTMENT_OTHER)

## 2024-02-18 ENCOUNTER — Encounter (HOSPITAL_BASED_OUTPATIENT_CLINIC_OR_DEPARTMENT_OTHER): Payer: Self-pay

## 2024-03-03 ENCOUNTER — Ambulatory Visit (HOSPITAL_BASED_OUTPATIENT_CLINIC_OR_DEPARTMENT_OTHER)
